# Patient Record
Sex: Female | Born: 1975 | Race: Black or African American | Hispanic: No | Marital: Single | Smoking: Never smoker
Health system: Southern US, Community
[De-identification: ages and names within clinical notes are randomized; demographics above are authoritative.]

## PROBLEM LIST (undated history)

## (undated) ENCOUNTER — Emergency Department (HOSPITAL_COMMUNITY): Admission: EM | Discharge status: Absent without leave | Payer: 59

## (undated) DIAGNOSIS — N739 Female pelvic inflammatory disease, unspecified: Secondary | ICD-10-CM

## (undated) HISTORY — PX: TONSILLECTOMY: SUR1361

## (undated) HISTORY — PX: TUBAL LIGATION: SHX77

## (undated) HISTORY — PX: DILATION AND CURETTAGE OF UTERUS: SHX78

---

## 2006-08-17 ENCOUNTER — Emergency Department (HOSPITAL_COMMUNITY): Admission: EM | Admit: 2006-08-17 | Discharge: 2006-08-17 | Payer: Self-pay | Admitting: Emergency Medicine

## 2009-10-23 ENCOUNTER — Encounter: Admission: RE | Admit: 2009-10-23 | Discharge: 2009-10-23 | Payer: Self-pay | Admitting: Obstetrics and Gynecology

## 2010-12-06 DIAGNOSIS — N739 Female pelvic inflammatory disease, unspecified: Secondary | ICD-10-CM

## 2010-12-06 HISTORY — DX: Female pelvic inflammatory disease, unspecified: N73.9

## 2011-08-14 ENCOUNTER — Encounter (HOSPITAL_COMMUNITY): Payer: Self-pay

## 2011-08-14 ENCOUNTER — Observation Stay (HOSPITAL_COMMUNITY)
Admission: AD | Admit: 2011-08-14 | Discharge: 2011-08-15 | Disposition: A | Discharge status: Home / Self Care | Payer: 59 | Source: Ambulatory Visit | Attending: Obstetrics | Admitting: Obstetrics

## 2011-08-14 DIAGNOSIS — N949 Unspecified condition associated with female genital organs and menstrual cycle: Secondary | ICD-10-CM | POA: Insufficient documentation

## 2011-08-14 DIAGNOSIS — R109 Unspecified abdominal pain: Secondary | ICD-10-CM | POA: Insufficient documentation

## 2011-08-14 DIAGNOSIS — N739 Female pelvic inflammatory disease, unspecified: Principal | ICD-10-CM | POA: Insufficient documentation

## 2011-08-14 DIAGNOSIS — R112 Nausea with vomiting, unspecified: Secondary | ICD-10-CM | POA: Insufficient documentation

## 2011-08-14 DIAGNOSIS — N730 Acute parametritis and pelvic cellulitis: Secondary | ICD-10-CM | POA: Diagnosis present

## 2011-08-14 DIAGNOSIS — N83209 Unspecified ovarian cyst, unspecified side: Secondary | ICD-10-CM | POA: Insufficient documentation

## 2011-08-14 DIAGNOSIS — R102 Pelvic and perineal pain unspecified side: Secondary | ICD-10-CM | POA: Diagnosis present

## 2011-08-14 LAB — COMPREHENSIVE METABOLIC PANEL
ALT: 13 U/L (ref 0–35)
AST: 14 U/L (ref 0–37)
Albumin: 3.5 g/dL (ref 3.5–5.2)
Alkaline Phosphatase: 56 U/L (ref 39–117)
BUN: 7 mg/dL (ref 6–23)
CO2: 27 mEq/L (ref 19–32)
Calcium: 9.2 mg/dL (ref 8.4–10.5)
Chloride: 104 mEq/L (ref 96–112)
Creatinine, Ser: 0.75 mg/dL (ref 0.50–1.10)
GFR calc Af Amer: >60 mL/min (ref >60)
GFR calc non Af Amer: >60 mL/min (ref >60)
Glucose, Bld: 92 mg/dL (ref 70–99)
Potassium: 3.4 mEq/L — ABNORMAL LOW (ref 3.5–5.1)
Sodium: 138 mEq/L (ref 135–145)
Total Bilirubin: 0.6 mg/dL (ref 0.3–1.2)
Total Protein: 6.8 g/dL (ref 6.0–8.3)

## 2011-08-14 LAB — CBC
HCT: 36.1 % (ref 36.0–46.0)
Hemoglobin: 11.9 g/dL — ABNORMAL LOW (ref 12.0–15.0)
MCH: 28.9 pg (ref 26.0–34.0)
MCHC: 33 g/dL (ref 30.0–36.0)
MCV: 87.6 fL (ref 78.0–100.0)
Platelets: 314 10*3/uL (ref 150–400)
RBC: 4.12 MIL/uL (ref 3.87–5.11)
RDW: 13.7 % (ref 11.5–15.5)
WBC: 16.9 10*3/uL — ABNORMAL HIGH (ref 4.0–10.5)

## 2011-08-14 LAB — DIFFERENTIAL
Basophils Absolute: 0 10*3/uL (ref 0.0–0.1)
Basophils Relative: 0 % (ref 0–1)
Eosinophils Absolute: 0.1 10*3/uL (ref 0.0–0.7)
Eosinophils Relative: 1 % (ref 0–5)
Lymphocytes Relative: 20 % (ref 12–46)
Lymphs Abs: 3.3 10*3/uL (ref 0.7–4.0)
Monocytes Absolute: 1 10*3/uL (ref 0.1–1.0)
Monocytes Relative: 6 % (ref 3–12)
Neutro Abs: 12.5 10*3/uL — ABNORMAL HIGH (ref 1.7–7.7)
Neutrophils Relative %: 74 % (ref 43–77)

## 2011-08-14 LAB — URINALYSIS, ROUTINE W REFLEX MICROSCOPIC
Bilirubin Urine: NEGATIVE
Glucose, UA: NEGATIVE mg/dL
Ketones, ur: NEGATIVE mg/dL
Leukocytes, UA: NEGATIVE
Nitrite: NEGATIVE
Protein, ur: NEGATIVE mg/dL
Specific Gravity, Urine: 1.015 (ref 1.005–1.030)
Urobilinogen, UA: 0.2 mg/dL (ref 0.0–1.0)
pH: 6 (ref 5.0–8.0)

## 2011-08-14 LAB — URINE MICROSCOPIC-ADD ON

## 2011-08-14 LAB — WET PREP, GENITAL
Trich, Wet Prep: NONE SEEN
Yeast Wet Prep HPF POC: NONE SEEN

## 2011-08-14 LAB — HCG, SERUM, QUALITATIVE: Preg, Serum: NEGATIVE

## 2011-08-14 MED ORDER — KETOROLAC TROMETHAMINE 30 MG/ML IJ SOLN: 30.0000 mg | Freq: Four times a day (QID) | INTRAMUSCULAR | Status: DC

## 2011-08-14 MED ORDER — KETOROLAC TROMETHAMINE 30 MG/ML IJ SOLN
30.0000 mg | Freq: Four times a day (QID) | INTRAMUSCULAR | Status: DC
  Administered 2011-08-14 – 2011-08-15 (×4): 30 mg via INTRAVENOUS
  Filled 2011-08-14 (×4): qty 1

## 2011-08-14 MED ORDER — PROMETHAZINE HCL 25 MG/ML IJ SOLN
12.5000 mg | INTRAMUSCULAR | Status: DC | PRN
  Administered 2011-08-14 – 2011-08-15 (×2): 12.5 mg via INTRAVENOUS
  Filled 2011-08-14 (×2): qty 1

## 2011-08-14 MED ORDER — CEFTRIAXONE SODIUM 250 MG IJ SOLR
250.0000 mg | INTRAMUSCULAR | Status: DC
  Administered 2011-08-14: 250 mg via INTRAMUSCULAR
  Filled 2011-08-14: qty 250

## 2011-08-14 MED ORDER — AZITHROMYCIN 1 G PO PACK
1.0000 g | PACK | Freq: Once | ORAL | Status: AC
  Administered 2011-08-15: 1 g via ORAL
  Filled 2011-08-14: qty 1

## 2011-08-14 MED ORDER — ZOLPIDEM TARTRATE 5 MG PO TABS: 5.0000 mg | ORAL_TABLET | Freq: Every evening | ORAL | Status: DC | PRN

## 2011-08-14 MED ORDER — METRONIDAZOLE IN NACL 5-0.79 MG/ML-% IV SOLN
500.0000 mg | Freq: Four times a day (QID) | INTRAVENOUS | Status: DC
  Administered 2011-08-14 – 2011-08-15 (×4): 500 mg via INTRAVENOUS
  Filled 2011-08-14 (×8): qty 100

## 2011-08-14 MED ORDER — DOXYCYCLINE HYCLATE 100 MG IV SOLR
100.0000 mg | Freq: Two times a day (BID) | INTRAVENOUS | Status: DC
  Administered 2011-08-14 – 2011-08-15 (×2): 100 mg via INTRAVENOUS
  Filled 2011-08-14 (×4): qty 100

## 2011-08-14 MED ORDER — LACTATED RINGERS IV SOLN
INTRAVENOUS | Status: DC
  Administered 2011-08-14 – 2011-08-15 (×2): via INTRAVENOUS

## 2011-08-14 NOTE — ED Provider Notes (Signed)
History     Chief Complaint  Patient presents with  . Abdominal Pain   Abdominal Pain This is a new problem. The current episode started yesterday. The onset quality is sudden. The problem occurs constantly. The pain is located in the LLQ and generalized abdominal region. The pain is moderate. The quality of the pain is aching, cramping and sharp. The abdominal pain does not radiate. Associated symptoms include a fever, nausea and vomiting. Pertinent negatives include no anorexia, constipation, diarrhea, dysuria, frequency or headaches. It is movement what aggravates the pain. The pain is relieved by nothing. She has tried oral narcotic analgesics for the symptoms. The treatment provided no relief. Prior diagnostic workup includes CT scan. Her past medical history is significant for abdominal surgery. C/S x 3 - failed BTL - open tube unilateral with pregnancy after BTL  Seen at [facility] late last night told she had GI virus after CT scan - given antiemetic and analgesia. Pain persisted and worsened with 2 more episodes of vomiting - went to Urgent care where they recommended being seen by [facility]. She was told her white count was high at 18. Onset vaginal discharge today - brown with odor. LMP 8-13 to 8/17. Reports intermittent breast tenderness as well.    History reviewed. No pertinent past medical history.  Past Surgical History  Procedure Date  . Cesarean section   . Dilation and curettage of uterus   . Tubal ligation   . Tonsillectomy     Family History  Problem Relation Age of Onset  . Arthritis Mother   . Hypertension Mother   . Arthritis Father   . Cancer Father   . Hypertension Sister   . Stroke Sister   . Kidney disease Sister     History  Substance Use Topics  . Smoking status: Never Smoker   . Smokeless tobacco: Never Used  . Alcohol Use: No    Allergies: No Known Allergies  Prescriptions prior to admission  Medication Sig Dispense Refill  . BORIC ACID EX  Place 1 capsule vaginally at bedtime as needed. For "maintenance"       . Naproxen Sodium (ALEVE PO) Take 2 tablets by mouth as needed. For pain, headache, cramps          Review of Systems  Constitutional: Positive for fever and malaise/fatigue. Negative for chills.  Gastrointestinal: Positive for nausea, vomiting and abdominal pain. Negative for diarrhea, constipation and anorexia.  Genitourinary: Negative for dysuria and frequency.  Neurological: Negative for weakness and headaches.   Physical Exam   Blood pressure 143/86, pulse 85, temperature 99.4 F (37.4 C), temperature source Oral, resp. rate 18, height 5\' 2"  (1.575 m), weight 100.426 kg (221 lb 6.4 oz), last menstrual period 07/21/2011.  Physical Exam  Constitutional: She is oriented to person, place, and time. She appears well-developed and well-nourished.  HENT:  Head: Normocephalic.  Neck: Normal range of motion. No thyromegaly present.  Cardiovascular: Normal rate.   GI: Soft. Bowel sounds are normal. She exhibits no distension and no mass. There is tenderness. There is rebound. There is no guarding.  Genitourinary: Vaginal discharge found.  Musculoskeletal: Normal range of motion.  Neurological: She is alert and oriented to person, place, and time.  Skin: Skin is warm and dry.  Psychiatric: She has a normal mood and affect.  Spec exam : + malodorous discharge - tannish-brown in color / watery consistency - wet prep and STD probe sent Bimanual : uterus retroverted with + CMT /  Adnexal bilaterally tender   MAU Course  Procedures - review CT from [facility]  Mild atelectasis present bilaterally.  Liver - gallbladder - spleen - adrenals - pancreas - kidneys normal. Normal appendix visualized. ? Hypodense lesion Right adnexa - 2cm size - possible ovarian cyst. No free fluid or free air. Bladder normal.  + Right lymphedema - right lower pelvis   Assessment and Plan  Abdominal Pain  No evidence of  appendicitis Possible pelvic infection - unknown etiology at this point - STD panel pending Must rule out pregnancy and risk of ectopic  Consult MD when initially labs are resulted  Start IV fluids for hydration - hold analgesia until pregnancy test is resulted  Stephanie Lee 08/14/2011, 5:20 PM

## 2011-08-14 NOTE — ED Provider Notes (Signed)
Agree with note and plan. Inpatient IVF and antibiotics since nausea/vomiting and will fail therapy. If better tomorrow, will d/c home with outpatient PID treatment.

## 2011-08-14 NOTE — H&P (Signed)
Stephanie Lee is an 35 y.o. female. With acute abdominal pain x 2 days - seen at [facility] - CT negative except 2.5 cm ovarian cyst Right adnexa Positive nausea and vomiting - given Zofran last night - nothing to eat or drink today.   Pertinent Gynecological History: Menses: regular every month without intermenstrual spotting Bleeding: brown discharge this AM Contraception: none Sexually transmitted diseases: no past history and history recurrent BV Previous GYN Procedures: CS x 3 - failed BTL  Last mammogram: normal Date: 2012 Last pap: normal Date: Dec 2011 OB History: G3, P3   Menstrual History: Patient's last menstrual period was 07/21/2011.    History reviewed. No pertinent past medical history.  Past Surgical History  Procedure Date  . Cesarean section   . Dilation and curettage of uterus   . Tubal ligation   . Tonsillectomy     Family History  Problem Relation Age of Onset  . Arthritis Mother   . Hypertension Mother   . Arthritis Father   . Cancer Father   . Hypertension Sister   . Stroke Sister   . Kidney disease Sister     Social History:  reports that she has never smoked. She has never used smokeless tobacco. She reports that she does not drink alcohol or use illicit drugs.  Allergies: No Known Allergies  Prescriptions prior to admission  Medication Sig Dispense Refill  . BORIC ACID EX Place 1 capsule vaginally at bedtime as needed. For "maintenance"       . Naproxen Sodium (ALEVE PO) Take 2 tablets by mouth as needed. For pain, headache, cramps          ROS : Abdominal pain x 24 hours - + nausea with vomiting - + vaginal discharge with odor  Blood pressure 143/86, pulse 85, temperature 99.4 F (37.4 C), temperature source Oral, resp. rate 18, height 5\' 2"  (1.575 m), weight 100.426 kg (221 lb 6.4 oz), last menstrual period 07/21/2011. Physical Exam  See MAU note for complete examination  Results for orders placed during the hospital  encounter of 08/14/11 (from the past 24 hour(s))  URINALYSIS, ROUTINE W REFLEX MICROSCOPIC     Status: Abnormal   Collection Time   08/14/11  5:14 PM      Component Value Range   Color, Urine YELLOW  YELLOW    Appearance CLEAR  CLEAR    Specific Gravity, Urine 1.015  1.005 - 1.030    pH 6.0  5.0 - 8.0    Glucose, UA NEGATIVE  NEGATIVE (mg/dL)   Hgb urine dipstick TRACE (*) NEGATIVE    Bilirubin Urine NEGATIVE  NEGATIVE    Ketones, ur NEGATIVE  NEGATIVE (mg/dL)   Protein, ur NEGATIVE  NEGATIVE (mg/dL)   Urobilinogen, UA 0.2  0.0 - 1.0 (mg/dL)   Nitrite NEGATIVE  NEGATIVE    Leukocytes, UA NEGATIVE  NEGATIVE   URINE MICROSCOPIC-ADD ON     Status: Normal   Collection Time   08/14/11  5:14 PM      Component Value Range   WBC, UA 0-2  <3 (WBC/hpf)   RBC / HPF 0-2  <3 (RBC/hpf)  WET PREP, GENITAL     Status: Abnormal   Collection Time   08/14/11  5:20 PM      Component Value Range   Yeast, Wet Prep NONE SEEN  NONE SEEN    Trich, Wet Prep NONE SEEN  NONE SEEN    Clue Cells, Wet Prep MANY (*) NONE  SEEN    WBC, Wet Prep HPF POC MANY (*) NONE SEEN   CBC     Status: Abnormal   Collection Time   08/14/11  5:22 PM      Component Value Range   WBC 16.9 (*) 4.0 - 10.5 (K/uL)   RBC 4.12  3.87 - 5.11 (MIL/uL)   Hemoglobin 11.9 (*) 12.0 - 15.0 (g/dL)   HCT 91.4  78.2 - 95.6 (%)   MCV 87.6  78.0 - 100.0 (fL)   MCH 28.9  26.0 - 34.0 (pg)   MCHC 33.0  30.0 - 36.0 (g/dL)   RDW 21.3  08.6 - 57.8 (%)   Platelets 314  150 - 400 (K/uL)  DIFFERENTIAL     Status: Abnormal   Collection Time   08/14/11  5:22 PM      Component Value Range   Neutrophils Relative 74  43 - 77 (%)   Neutro Abs 12.5 (*) 1.7 - 7.7 (K/uL)   Lymphocytes Relative 20  12 - 46 (%)   Lymphs Abs 3.3  0.7 - 4.0 (K/uL)   Monocytes Relative 6  3 - 12 (%)   Monocytes Absolute 1.0  0.1 - 1.0 (K/uL)   Eosinophils Relative 1  0 - 5 (%)   Eosinophils Absolute 0.1  0.0 - 0.7 (K/uL)   Basophils Relative 0  0 - 1 (%)   Basophils Absolute  0.0  0.0 - 0.1 (K/uL)  COMPREHENSIVE METABOLIC PANEL     Status: Abnormal   Collection Time   08/14/11  5:22 PM      Component Value Range   Sodium 138  135 - 145 (mEq/L)   Potassium 3.4 (*) 3.5 - 5.1 (mEq/L)   Chloride 104  96 - 112 (mEq/L)   CO2 27  19 - 32 (mEq/L)   Glucose, Bld 92  70 - 99 (mg/dL)   BUN 7  6 - 23 (mg/dL)   Creatinine, Ser 4.69  0.50 - 1.10 (mg/dL)   Calcium 9.2  8.4 - 62.9 (mg/dL)   Total Protein 6.8  6.0 - 8.3 (g/dL)   Albumin 3.5  3.5 - 5.2 (g/dL)   AST 14  0 - 37 (U/L)   ALT 13  0 - 35 (U/L)   Alkaline Phosphatase 56  39 - 117 (U/L)   Total Bilirubin 0.6  0.3 - 1.2 (mg/dL)   GFR calc non Af Amer >60  >60 (mL/min)   GFR calc Af Amer >60  >60 (mL/min)  HCG, SERUM, QUALITATIVE     Status: Normal   Collection Time   08/14/11  5:22 PM      Component Value Range   Preg, Serum NEGATIVE  NEGATIVE     No results found.  Assessment/Plan: Abdominal pain Presumptive PID - possible STD Negative pregnancy test - ruled out risk ectopic No evidence of appendicitis or GI etiology  1) Consulted with Dr Juliene Pina - outpt versus 23 hours obs with IVF and antibiotics - agree with plan to keep tonight 2) IV ABX : Doxycycline 100 IV Q12 / Flagyl 500mg  Q12 3) treatment for presumptive risk for STD - azithromycin and rocephin 4) anticipate discharge in 12-24 hours with improved status 5) Give toradol for pain / phenergan for N/V    [name] 08/14/2011, 6:07 PM

## 2011-08-14 NOTE — Progress Notes (Signed)
Was seen at [facility] last night, got worse was seen at Urgent Care, had CT scan cyst on left side, pain is midline radiating to right, was told had stomach virus, elevated WBC abdomen tender to touch thinks might be PID

## 2011-08-14 NOTE — H&P (Signed)
Agree with note and plan. Findings and management reviewed.

## 2011-08-15 DIAGNOSIS — N730 Acute parametritis and pelvic cellulitis: Secondary | ICD-10-CM | POA: Diagnosis present

## 2011-08-15 MED ORDER — DOXYCYCLINE HYCLATE 100 MG PO TABS: 100.0000 mg | ORAL_TABLET | Freq: Two times a day (BID) | ORAL | Status: AC

## 2011-08-15 MED ORDER — SODIUM CHLORIDE 0.9 % IJ SOLN
3.0000 mL | INTRAMUSCULAR | Status: DC | PRN
  Administered 2011-08-15 (×4): 3 mL via INTRAVENOUS

## 2011-08-15 MED ORDER — PROMETHAZINE HCL 12.5 MG PO TABS: 12.5000 mg | ORAL_TABLET | Freq: Two times a day (BID) | ORAL | Status: AC

## 2011-08-15 MED ORDER — METRONIDAZOLE 500 MG PO TABS: 500.0000 mg | ORAL_TABLET | Freq: Two times a day (BID) | ORAL | Status: AC

## 2011-08-15 NOTE — Plan of Care (Signed)
Problem: Phase I Progression Outcomes Goal: Pain controlled with appropriate interventions Outcome: Progressing Patient received IV Phenergan and Toradol in MAU Goal: OOB as tolerated unless otherwise ordered Outcome: Progressing Up to bathroom. Goal: Initial discharge plan identified Outcome: Progressing Pain control and understanding of what can cause PID Goal: Voiding-avoid urinary catheter unless indicated Outcome: Progressing Patient voids on her own  Problem: Phase II Progression Outcomes Goal: Progress activity as tolerated unless otherwise ordered Outcome: Progressing BRP Goal: IV changed to normal saline lock Outcome: Not Applicable Date Met:  08/15/11 IV site is new and CDI

## 2011-08-15 NOTE — Discharge Summary (Signed)
RN reviewed discharge instructions with patient and gave her prescriptions provided by MD. Patient had no complaints at time of discharge. Patient was walked off the unit by nurse tech. She left the campus in a private vehicle.

## 2011-08-15 NOTE — Progress Notes (Signed)
Subjective: Admitted for presumed PID and nausea vomiting, needing hospitalization for PID treatment initiation. Admission reviewed with CNM on 08/14/11.  Overnight she did well, pain much improved with mild tenderness on touch, no further nausea/vomiting, tolerated general diet for breakfast . No fever/chills/flu like symptoms. Feels much better and ready to complete outpatient PID treatment.  LMP 07/22/11.  Objective: Vital signs in last 24 hours: Temp:  [98 F (36.7 C)-99.4 F (37.4 C)] 98.1 F (36.7 C) (09/09 0622) Pulse Rate:  [72-98] 98  (09/09 0622) Resp:  [16-18] 18  (09/09 0622) BP: (93-143)/(59-86) 113/74 mmHg (09/09 0622) SpO2:  [97 %-98 %] 98 % (09/09 0622) Weight:  [221 lb 6.4 oz (100.426 kg)] 221 lb 6.4 oz (100.426 kg) (09/08 1444)  Intake/Output from previous day: 09/08 0701 - 09/09 0700 In: 570 [P.O.:220] Out: 500 [Urine:500] Intake/Output this shift: Total I/O In: 3 [I.V.:3] Out: 300 [Urine:300]  A&O x 3, no acute distress. Pleasant HEENT neg, no thyromegaly Lungs CTA bilat CV RRR, S1S2 normal Abdo soft,  non acute with no rebound/ guarding. Minimal generalized tenderness in all quadrants, normal bowel sounds all quadrants.  Extr no edema/ tenderness Pelvic deferred.   Results for orders placed during the hospital encounter of 08/14/11 (from the past 24 hour(s))  URINALYSIS, ROUTINE W REFLEX MICROSCOPIC     Status: Abnormal   Collection Time   08/14/11  5:14 PM      Component Value Range   Color, Urine YELLOW  YELLOW    Appearance CLEAR  CLEAR    Specific Gravity, Urine 1.015  1.005 - 1.030    pH 6.0  5.0 - 8.0    Glucose, UA NEGATIVE  NEGATIVE (mg/dL)   Hgb urine dipstick TRACE (*) NEGATIVE    Bilirubin Urine NEGATIVE  NEGATIVE    Ketones, ur NEGATIVE  NEGATIVE (mg/dL)   Protein, ur NEGATIVE  NEGATIVE (mg/dL)   Urobilinogen, UA 0.2  0.0 - 1.0 (mg/dL)   Nitrite NEGATIVE  NEGATIVE    Leukocytes, UA NEGATIVE  NEGATIVE   URINE MICROSCOPIC-ADD ON      Status: Normal   Collection Time   08/14/11  5:14 PM      Component Value Range   WBC, UA 0-2  <3 (WBC/hpf)   RBC / HPF 0-2  <3 (RBC/hpf)  WET PREP, GENITAL     Status: Abnormal   Collection Time   08/14/11  5:20 PM      Component Value Range   Yeast, Wet Prep NONE SEEN  NONE SEEN    Trich, Wet Prep NONE SEEN  NONE SEEN    Clue Cells, Wet Prep MANY (*) NONE SEEN    WBC, Wet Prep HPF POC MANY (*) NONE SEEN   CBC     Status: Abnormal   Collection Time   08/14/11  5:22 PM      Component Value Range   WBC 16.9 (*) 4.0 - 10.5 (K/uL)   RBC 4.12  3.87 - 5.11 (MIL/uL)   Hemoglobin 11.9 (*) 12.0 - 15.0 (g/dL)   HCT 16.1  09.6 - 04.5 (%)   MCV 87.6  78.0 - 100.0 (fL)   MCH 28.9  26.0 - 34.0 (pg)   MCHC 33.0  30.0 - 36.0 (g/dL)   RDW 40.9  81.1 - 91.4 (%)   Platelets 314  150 - 400 (K/uL)  DIFFERENTIAL     Status: Abnormal   Collection Time   08/14/11  5:22 PM      Component Value  Range   Neutrophils Relative 74  43 - 77 (%)   Neutro Abs 12.5 (*) 1.7 - 7.7 (K/uL)   Lymphocytes Relative 20  12 - 46 (%)   Lymphs Abs 3.3  0.7 - 4.0 (K/uL)   Monocytes Relative 6  3 - 12 (%)   Monocytes Absolute 1.0  0.1 - 1.0 (K/uL)   Eosinophils Relative 1  0 - 5 (%)   Eosinophils Absolute 0.1  0.0 - 0.7 (K/uL)   Basophils Relative 0  0 - 1 (%)   Basophils Absolute 0.0  0.0 - 0.1 (K/uL)  COMPREHENSIVE METABOLIC PANEL     Status: Abnormal   Collection Time   08/14/11  5:22 PM      Component Value Range   Sodium 138  135 - 145 (mEq/L)   Potassium 3.4 (*) 3.5 - 5.1 (mEq/L)   Chloride 104  96 - 112 (mEq/L)   CO2 27  19 - 32 (mEq/L)   Glucose, Bld 92  70 - 99 (mg/dL)   BUN 7  6 - 23 (mg/dL)   Creatinine, Ser 1.61  0.50 - 1.10 (mg/dL)   Calcium 9.2  8.4 - 09.6 (mg/dL)   Total Protein 6.8  6.0 - 8.3 (g/dL)   Albumin 3.5  3.5 - 5.2 (g/dL)   AST 14  0 - 37 (U/L)   ALT 13  0 - 35 (U/L)   Alkaline Phosphatase 56  39 - 117 (U/L)   Total Bilirubin 0.6  0.3 - 1.2 (mg/dL)   GFR calc non Af Amer >60  >60 (mL/min)     GFR calc Af Amer >60  >60 (mL/min)  HCG, SERUM, QUALITATIVE     Status: Normal   Collection Time   08/14/11  5:22 PM      Component Value Range   Preg, Serum NEGATIVE  NEGATIVE     Studies/Results: No labs today.   Scheduled Meds:   . azithromycin  1 g Oral Once  . doxycycline (VIBRAMYCIN) IV  100 mg Intravenous Q12H  . ketorolac  30 mg Intravenous Q6H   Or  . ketorolac  30 mg Intramuscular Q6H  . metronidazole  500 mg Intravenous Q6H  . DISCONTD: cefTRIAXone  250 mg Intramuscular Q24H   Continuous Infusions:   . lactated ringers Stopped (08/15/11 0808)   PRN Meds:promethazine, sodium chloride, zolpidem  Assessment/Plan: HD #2, PID. Much improved, pain better, nausea/vomiting resolved.  S/p Ceftriaxone and PO Azithromycin for Gono/chlam coverage.  Continue Doxy/ flagyl for total 14 days PO Phenergan 12.5 mg PO with medications (since gets nausea with flagyl)  Ibuprofen prn.  F/up 2 wks in office with Dr Cherly Hensen.  Reportable/ warning signs reviewed, has good support. Resume work tomorrow if possible.   Korissa Horsford R

## 2011-08-15 NOTE — Discharge Summary (Signed)
  Physician Discharge Summary  Patient ID: Stephanie Lee  MRN: [id number]  DOB/AGE: July 21, 1976 35 y.o.  Admit date: 08/14/2011 Discharge date: 08/15/2011  Admission Diagnoses: Pelvic pain, nausea, vomiting, PID Discharge Diagnoses: same Discharged Condition: good, improved  Hospital Course: Admitted for inpatient PID treatment due to nausea/vomiting and not able to take any medications. Did well overnight, was able to tolerate general diet for breakfast and take PO Azithromycin. Nausea/vomiting/ pain resolved.   Significant Diagnostic Studies:  [facility] - CT negative except 2.5 cm ovarian cyst Right adnexa  Treatments:  S/p Ceftriaxone and PO Azithromycin for Gono/chlam coverage.  Doxy/ flagyl IV q 12 hrs, and will continue PO for total 14 days  Phenergan 12.5 mg PO with medications (since gets nausea with flagyl)  Ibuprofen prn.   Discharge Exam: Blood pressure 113/74, pulse 98, temperature 98.1 F (36.7 C), temperature source Oral, resp. rate 18, height 5\' 2"  (1.575 m), weight 221 lb 6.4 oz (100.426 kg), last menstrual period 07/21/2011, SpO2 98.00%. Improved exam.   Disposition: Home  Current Discharge Medication List    START taking these medications   Details  doxycycline (VIBRA-TABS) 100 MG tablet Take 1 tablet (100 mg total) by mouth 2 (two) times daily. Qty: 26 tablet, Refills: 0    metroNIDAZOLE (FLAGYL) 500 MG tablet Take 1 tablet (500 mg total) by mouth 2 (two) times daily. Qty: 26 tablet, Refills: 0    promethazine (PHENERGAN) 12.5 MG tablet Take 1 tablet (12.5 mg total) by mouth 2 (two) times daily. Qty: 30 tablet, Refills: 0      CONTINUE these medications which have NOT CHANGED   Details  BORIC ACID EX Place 1 capsule vaginally at bedtime as needed. For "maintenance"     Naproxen Sodium (ALEVE PO) Take 2 tablets by mouth as needed. For pain, headache, cramps         F/up: Dr Cherly Hensen in 2 weeks in office.  Reportable/ warning signs reviewed,  has good support. Resume work tomorrow if possible.   --V.Juliene Pina, MD Jandy Brackens R 08/15/2011, 11:48 AM

## 2011-08-16 LAB — URINE CULTURE
Colony Count: 10000
Culture  Setup Time: 201209082254

## 2011-08-16 LAB — GC/CHLAMYDIA PROBE AMP, GENITAL
Chlamydia, DNA Probe: NEGATIVE
GC Probe Amp, Genital: NEGATIVE

## 2012-08-05 ENCOUNTER — Inpatient Hospital Stay (HOSPITAL_COMMUNITY)
Admission: AD | Admit: 2012-08-05 | Discharge: 2012-08-05 | Disposition: A | Discharge status: Home / Self Care | Payer: 59 | Source: Ambulatory Visit | Attending: Obstetrics and Gynecology | Admitting: Obstetrics and Gynecology

## 2012-08-05 ENCOUNTER — Encounter (HOSPITAL_COMMUNITY): Payer: Self-pay | Admitting: *Deleted

## 2012-08-05 ENCOUNTER — Inpatient Hospital Stay (HOSPITAL_COMMUNITY): Payer: 59

## 2012-08-05 DIAGNOSIS — R1031 Right lower quadrant pain: Secondary | ICD-10-CM | POA: Insufficient documentation

## 2012-08-05 HISTORY — DX: Female pelvic inflammatory disease, unspecified: N73.9

## 2012-08-05 LAB — CBC
HCT: 33.8 % — ABNORMAL LOW (ref 36.0–46.0)
MCH: 28.6 pg (ref 26.0–34.0)
MCV: 86.4 fL (ref 78.0–100.0)
Platelets: 315 10*3/uL (ref 150–400)
RBC: 3.91 MIL/uL (ref 3.87–5.11)

## 2012-08-05 LAB — WET PREP, GENITAL

## 2012-08-05 LAB — URINALYSIS, ROUTINE W REFLEX MICROSCOPIC
Bilirubin Urine: NEGATIVE
Ketones, ur: NEGATIVE mg/dL
Nitrite: NEGATIVE
pH: 7 (ref 5.0–8.0)

## 2012-08-05 MED ORDER — METRONIDAZOLE 500 MG PO TABS: 500.0000 mg | ORAL_TABLET | Freq: Two times a day (BID) | ORAL | Status: AC

## 2012-08-05 MED ORDER — KETOROLAC TROMETHAMINE 30 MG/ML IJ SOLN
30.0000 mg | Freq: Once | INTRAMUSCULAR | Status: DC
Start: 2012-08-05 — End: 2012-08-05
  Filled 2012-08-05: qty 1

## 2012-08-05 MED ORDER — DOXYCYCLINE HYCLATE 100 MG PO CAPS: 100.0000 mg | ORAL_CAPSULE | Freq: Two times a day (BID) | ORAL | Status: AC

## 2012-08-05 MED ORDER — OXYCODONE-ACETAMINOPHEN 5-325 MG PO TABS
1.0000 | ORAL_TABLET | Freq: Once | ORAL | Status: AC
  Administered 2012-08-05: 1 via ORAL
  Filled 2012-08-05: qty 1

## 2012-08-05 MED ORDER — KETOROLAC TROMETHAMINE 60 MG/2ML IM SOLN
30.0000 mg | Freq: Once | INTRAMUSCULAR | Status: AC
  Administered 2012-08-05: 30 mg via INTRAMUSCULAR

## 2012-08-05 NOTE — Progress Notes (Signed)
Dr Juliene Pina notified of pt's admission and status. Aware of upt neg and neg u/a. Orders received

## 2012-08-05 NOTE — Progress Notes (Signed)
Written and verbal d/c instructions given and understanding voiced. 

## 2012-08-05 NOTE — MAU Provider Note (Signed)
History     CSN: [id number]  Arrival date and time: 08/05/12 0248   None     Chief Complaint  Patient presents with  . Abdominal Pain   HPI Lower abdominal pain, 24 hrs. No abn vag discharge. Not with any new partner (current partner 3 yrs). Condoms. No nausea/ vomiting. No fever/chills. Pain is somewhat less intense since in MAU, now rates at 7/10 from 10/10 at home and Ibuprofen didn't help. No prior ovarian cysts/ no appendicitis hx. On further questioning, pain is often noted just before menses (this was worse) and can be on either side but always more intense on right. Pain gets better once period starts and she has at least 2-3 wks of pain free period before it starts again. No prior endometriosis hx, but has h/o recurrent BV.  Admitted for presumed PID Sept'12 overnight. Prior referral to pelvic pain clinic. C/s x 3, then failed tubal with IUP in 2002 (TOP), no repeat TL since. UPT negative today.  Recent UTI in office (Jul'13)- Klebsiella, was treated, but no TOC. UA neg today and no UTI s/s. No bowel complaints.    Past Medical History  Diagnosis Date  . PID (pelvic inflammatory disease) 2012    Past Surgical History  Procedure Date  . Cesarean section   . Dilation and curettage of uterus   . Tubal ligation   . Tonsillectomy     Family History  Problem Relation Age of Onset  . Arthritis Mother   . Hypertension Mother   . Arthritis Father   . Cancer Father   . Hypertension Sister   . Stroke Sister   . Kidney disease Sister   . Other Neg Hx     History  Substance Use Topics  . Smoking status: Never Smoker   . Smokeless tobacco: Never Used  . Alcohol Use: No    Allergies: No Known Allergies  Prescriptions prior to admission  Medication Sig Dispense Refill  . ibuprofen (ADVIL,MOTRIN) 200 MG tablet Take 600 mg by mouth every 6 (six) hours as needed.      . Naproxen Sodium (ALEVE PO) Take 2 tablets by mouth as needed. For pain, headache, cramps          . BORIC ACID EX Place 1 capsule vaginally at bedtime as needed. For "maintenance"         ROS Physical Exam   Blood pressure 133/80, pulse 82, temperature 98.6 F (37 C), temperature source Oral, resp. rate 20, height 5\' 2"  (1.575 m), weight 220 lb (99.791 kg), last menstrual period 06/26/2012.  Physical Exam Physical exam:  A&O x 3, no acute distress. Pleasant HEENT neg, no thyromegaly Lungs CTA bilat CV RRR, S1S2 normal Abdo soft, non acute but tender in RLQ. No rebound/guarding/ Extr no edema/ tenderness Pelvic- Spec exam- period blood (not heavy, but mucus like bloody dc) noted. Wet prep, G/C obtained. No CMT. Right adnexal/uterine tenderness (exam difficult due to body habitus).    MAU Course  Procedures Pelvic sono - essentially negative. Blood clot in endometrium. Right ovary not seen but no masses. Left ovary normal. No pelvic abscess.  UA -neg CBC - WBC 8, nl.  Wet prep- G/C sent  Assessment and Plan  RLQ pain. Does not appear to be PID but due to prior recurrent BV hx, will treat with Doxy/ Flagyl for 7 days.  Likely cyclic changes in uterus/ endometriosis/ adenomyosis/ pelvic adhesions, but mostly premenses "flares" of pain. No HTN, non smoker. Could benefit from  Continous OCs.   Plan- Percocet x1, Toradol IM. Rx Doxy, Flagyl. F/up office 2 wks.  Patient understands and agrees.   Zayyan Mullen R 08/05/2012, 5:13 AM

## 2012-08-05 NOTE — MAU Note (Signed)
Sharp pain in my right lower stomach since Friday morning. Has gradually gotten worse. Tried Ibuprofen and Aleve which aren't helping.

## 2012-08-08 LAB — GC/CHLAMYDIA PROBE AMP, GENITAL
Chlamydia, DNA Probe: NEGATIVE
GC Probe Amp, Genital: NEGATIVE

## 2014-10-07 ENCOUNTER — Encounter (HOSPITAL_COMMUNITY): Payer: Self-pay | Admitting: *Deleted

## 2015-05-10 ENCOUNTER — Inpatient Hospital Stay (HOSPITAL_COMMUNITY)
Admission: AD | Admit: 2015-05-10 | Discharge: 2015-05-11 | Disposition: A | Discharge status: Home / Self Care | Payer: 59 | Source: Ambulatory Visit | Attending: Obstetrics and Gynecology | Admitting: Obstetrics and Gynecology

## 2015-05-10 DIAGNOSIS — R197 Diarrhea, unspecified: Secondary | ICD-10-CM | POA: Diagnosis present

## 2015-05-10 DIAGNOSIS — A084 Viral intestinal infection, unspecified: Secondary | ICD-10-CM | POA: Diagnosis not present

## 2015-05-10 DIAGNOSIS — R109 Unspecified abdominal pain: Secondary | ICD-10-CM | POA: Diagnosis not present

## 2015-05-10 LAB — URINALYSIS, ROUTINE W REFLEX MICROSCOPIC
BILIRUBIN URINE: NEGATIVE
GLUCOSE, UA: NEGATIVE mg/dL
HGB URINE DIPSTICK: NEGATIVE
Ketones, ur: NEGATIVE mg/dL
LEUKOCYTES UA: NEGATIVE
Nitrite: NEGATIVE
PH: 5.5 (ref 5.0–8.0)
Protein, ur: NEGATIVE mg/dL
Specific Gravity, Urine: 1.025 (ref 1.005–1.030)
Urobilinogen, UA: 0.2 mg/dL (ref 0.0–1.0)

## 2015-05-10 LAB — POCT PREGNANCY, URINE: Preg Test, Ur: NEGATIVE

## 2015-05-10 NOTE — MAU Note (Signed)
Runny stools, nausea and vomiting (twice) , cramping LLQ of abd. Symptoms for 2 days.

## 2015-05-11 ENCOUNTER — Encounter (HOSPITAL_COMMUNITY): Payer: Self-pay | Admitting: *Deleted

## 2015-05-11 MED ORDER — LOPERAMIDE HCL 2 MG PO TABS: 2.0000 mg | ORAL_TABLET | Freq: Four times a day (QID) | ORAL | Status: DC | PRN

## 2015-05-11 MED ORDER — ONDANSETRON 4 MG PO TBDP
4.0000 mg | ORAL_TABLET | Freq: Once | ORAL | Status: AC
  Administered 2015-05-11: 4 mg via ORAL
  Filled 2015-05-11: qty 1

## 2015-05-11 MED ORDER — HYOSCYAMINE SULFATE 0.125 MG SL SUBL: 0.1250 mg | SUBLINGUAL_TABLET | SUBLINGUAL | Status: DC | PRN

## 2015-05-11 MED ORDER — LOPERAMIDE HCL 2 MG PO CAPS
2.0000 mg | ORAL_CAPSULE | Freq: Once | ORAL | Status: AC
  Administered 2015-05-11: 2 mg via ORAL
  Filled 2015-05-11: qty 1

## 2015-05-11 MED ORDER — ONDANSETRON 4 MG PO TBDP: 4.0000 mg | ORAL_TABLET | Freq: Three times a day (TID) | ORAL | Status: DC | PRN

## 2015-05-11 NOTE — Progress Notes (Signed)
Donette LarryMelanie Bhambri CNM in earlier to discuss d/c plan and medications. Written and verbal d/c instructions given and understanding voiced.

## 2015-05-11 NOTE — Progress Notes (Signed)
Kem ParkinsonM. Bhrambri CNM notified of pt's admission and status. Orders received and will see pt

## 2015-05-11 NOTE — Discharge Instructions (Signed)

## 2015-05-11 NOTE — MAU Provider Note (Signed)
  History     CSN: [id number]  Arrival date and time: 05/10/15 2235   First Provider Initiated Contact with Patient 05/11/15 0136      Chief Complaint  Patient presents with  . Diarrhea  . Emesis  . Fatigue   HPI Comments: [id number], non-pregnant female c/o nausea x2 episodes and several episodes of diarrhea for last 36 hrs. Reports severe intestinal cramping. No vomiting. Able to tolerate most liquids and some solid foods. No fever. No one in household sick.      Past Medical History  Diagnosis Date  . PID (pelvic inflammatory disease) 2012    Past Surgical History  Procedure Laterality Date  . Cesarean section    . Dilation and curettage of uterus    . Tubal ligation    . Tonsillectomy      Family History  Problem Relation Age of Onset  . Arthritis Mother   . Hypertension Mother   . Arthritis Father   . Cancer Father   . Hypertension Sister   . Stroke Sister   . Kidney disease Sister   . Other Neg Hx     History  Substance Use Topics  . Smoking status: Never Smoker   . Smokeless tobacco: Never Used  . Alcohol Use: No    Allergies: No Known Allergies  Prescriptions prior to admission  Medication Sig Dispense Refill Last Dose  . BORIC ACID EX Place 1 capsule vaginally at bedtime as needed. For "maintenance"    Past Week at Unknown time  . ibuprofen (ADVIL,MOTRIN) 200 MG tablet Take 600 mg by mouth every 6 (six) hours as needed.   08/04/2012 at 1500  . Naproxen Sodium (ALEVE PO) Take 2 tablets by mouth as needed. For pain, headache, cramps     08/04/2012 at 2300    Review of Systems  Constitutional: Positive for malaise/fatigue.  HENT: Negative.   Eyes: Negative.   Respiratory: Negative.   Cardiovascular: Negative.   Gastrointestinal: Positive for nausea, abdominal pain and diarrhea. Negative for blood in stool.  Genitourinary: Negative.   Musculoskeletal: Negative.   Skin: Negative.   Neurological: Negative.   Endo/Heme/Allergies: Negative.    Psychiatric/Behavioral: Negative.    Physical Exam   Blood pressure 137/89, pulse 91, temperature 98.9 F (37.2 C), resp. rate 20, height 5\' 2"  (1.575 m), weight 100.608 kg (221 lb 12.8 oz), last menstrual period 05/01/2015.  Physical Exam  Constitutional: She is oriented to person, place, and time. She appears well-developed and well-nourished.  obese  HENT:  Head: Normocephalic and atraumatic.  Neck: Normal range of motion. Neck supple.  Cardiovascular: Normal rate.   Respiratory: Effort normal.  GI: Soft. Bowel sounds are normal. She exhibits no distension and no mass. There is no tenderness. There is no rebound and no guarding.  Genitourinary:  deferred  Musculoskeletal: Normal range of motion.  Neurological: She is alert and oriented to person, place, and time.  Skin: Skin is warm and dry.  Psychiatric: She has a normal mood and affect.    MAU Course  Procedures  Imodium 2 mg po x1 Zofran 4 mg ODT x1 UA-WNL UPT-neg  Assessment and Plan  Viral gastroenteritis Intestinal cramping  Discharge home Zofran 4mg  ODT q8 hrs prn Imodium 2mg  po q4 hrs prn Levsin 0.125 mg SL q4 hrs prn BRAT diet Follow-up for worsening sx or no improvement over next 2-3 days  Laden Fieldhouse, N 05/11/2015, 1:46 AM

## 2015-10-11 ENCOUNTER — Encounter (HOSPITAL_BASED_OUTPATIENT_CLINIC_OR_DEPARTMENT_OTHER): Payer: Self-pay | Admitting: *Deleted

## 2015-10-11 ENCOUNTER — Emergency Department (HOSPITAL_BASED_OUTPATIENT_CLINIC_OR_DEPARTMENT_OTHER)
Admission: EM | Admit: 2015-10-11 | Discharge: 2015-10-11 | Disposition: A | Discharge status: Home / Self Care | Payer: 59 | Attending: Emergency Medicine | Admitting: Emergency Medicine

## 2015-10-11 ENCOUNTER — Emergency Department (HOSPITAL_BASED_OUTPATIENT_CLINIC_OR_DEPARTMENT_OTHER): Payer: 59

## 2015-10-11 DIAGNOSIS — R600 Localized edema: Secondary | ICD-10-CM | POA: Diagnosis not present

## 2015-10-11 DIAGNOSIS — M79645 Pain in left finger(s): Secondary | ICD-10-CM | POA: Insufficient documentation

## 2015-10-11 DIAGNOSIS — Z8742 Personal history of other diseases of the female genital tract: Secondary | ICD-10-CM | POA: Diagnosis not present

## 2015-10-11 MED ORDER — NAPROXEN 500 MG PO TABS: 500.0000 mg | ORAL_TABLET | Freq: Two times a day (BID) | ORAL | Status: DC

## 2015-10-11 MED ORDER — IBUPROFEN 800 MG PO TABS
800.0000 mg | ORAL_TABLET | Freq: Once | ORAL | Status: AC
  Administered 2015-10-11: 800 mg via ORAL
  Filled 2015-10-11: qty 1

## 2015-10-11 NOTE — ED Notes (Signed)
sm ice pack provided to pain area

## 2015-10-11 NOTE — ED Notes (Signed)
Pt teaching done re: application of splint, using ice and comfort along with pain meds, how to utilize ice for comfort measures as well. Opportunity for questions provided. Also provided name of MD to follow up with as per EDP orders

## 2015-10-11 NOTE — ED Notes (Signed)
Sore, tender, more at the base of the left thumb, describes as shooting pain, onset yesterday, denies any injury to area

## 2015-10-11 NOTE — ED Provider Notes (Signed)
CSN: [id number]     Arrival date & time 10/11/15  1324 History  By signing my name below, I, [name], attest that this documentation has been prepared under the direction and in the presence of Glynn Octave, MD . Electronically Signed: [name], Scribe. 10/11/2015. 10:44 AM.   Chief Complaint  Patient presents with  . Hand Pain   The history is provided by the patient. No language interpreter was used.     HPI Comments:  Stephanie Lee is a 39 y.o. female who presents to the Emergency Department complaining of atraumatic pain and swelling to the base of the left thumb which began yesterday. She reports increased pain with movement and palpation of the site; notes pain has progressively worsened since onset. Pt denies recent fall/ injury. She also denies h/o same and h/o arthirtis. Pt is right hand dominant. 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 No alleviating factors noted; no treatments tried PTA. Pt has no other complaints or symptoms at this time.    Past Medical History  Diagnosis Date  . PID (pelvic inflammatory disease) 2012   Past Surgical History  Procedure Laterality Date  . Cesarean section    . Dilation and curettage of uterus    . Tubal ligation    . Tonsillectomy     Family History  Problem Relation Age of Onset  . Arthritis Mother   . Hypertension Mother   . Arthritis Father   . Cancer Father   . Hypertension Sister   . Stroke Sister   . Kidney disease Sister   . Other Neg Hx    Social History  Substance Use Topics  . Smoking status: Never Smoker   . Smokeless tobacco: Never Used  . Alcohol Use: No   OB History    Gravida Para Term Preterm AB TAB SAB Ectopic Multiple Living   0 2 2 0 0 0 3     Review of Systems  Constitutional: Negative for fever and chills.  Respiratory: Negative for shortness of breath.   Cardiovascular: Negative for chest pain.  Musculoskeletal: Positive for myalgias and arthralgias.        Left hand    Allergies  Review of patient's allergies indicates no known allergies.  Home Medications   Prior to Admission medications   Medication Sig Start Date End Date Taking? Authorizing Provider  BORIC ACID EX Place 1 capsule vaginally at bedtime as needed. For "maintenance"     Historical Provider, MD  hyoscyamine (LEVSIN/SL) 0.125 MG SL tablet Place 1 tablet (0.125 mg total) under the tongue every 4 (four) hours as needed. 05/11/15   Lawernce Pitts, CNM  loperamide (IMODIUM A-D) 2 MG tablet Take 1 tablet (2 mg total) by mouth 4 (four) times daily as needed for diarrhea or loose stools. 05/11/15   Lawernce Pitts, CNM  naproxen (NAPROSYN) 500 MG tablet Take 1 tablet (500 mg total) by mouth 2 (two) times daily. 10/11/15   Glynn Octave, MD  ondansetron (ZOFRAN ODT) 4 MG disintegrating tablet Take 1 tablet (4 mg total) by mouth every 8 (eight) hours as needed for nausea or vomiting. 05/11/15   Lawernce Pitts, CNM   BP 130/99 mmHg  Pulse 80  Temp(Src) 98.2 F (36.8 C) (Oral)  Resp 18  Ht  (1.575 m)  Wt 221 lb (100.245 kg)  BMI 40.41 kg/m2  SpO2 97%  LMP 10/06/2015 (Approximate) Physical Exam  Constitutional: She is  oriented to person, place, and time. She appears well-developed and well-nourished. No distress.  HENT:  Head: Normocephalic and atraumatic.  Mouth/Throat: Oropharynx is clear and moist. No oropharyngeal exudate.  Eyes: Conjunctivae and EOM are normal. Pupils are equal, round, and reactive to light.  Neck: Normal range of motion. Neck supple.  No meningismus.  Cardiovascular: Normal rate, regular rhythm, normal heart sounds and intact distal pulses.   No murmur heard. Pulses:      Radial pulses are 2+ on the left side.  Pulmonary/Chest: Effort normal and breath sounds normal. No respiratory distress.  Abdominal: Soft. There is no tenderness. There is no rebound and no guarding.  Musculoskeletal: Normal range of motion. She exhibits no edema or  tenderness.  Left thumb TTP at MCP joint with reduced ROM  No erythema Intact radial pulse Able to oppose and extend thumb with pain Cap refill and sensation intact  Neurological: She is alert and oriented to person, place, and time. No cranial nerve deficit. She exhibits normal muscle tone. Coordination normal.  No ataxia on finger to nose bilaterally. No pronator drift. 5/5 strength throughout. CN 2-12 intact.Equal grip strength. Sensation intact.   Skin: Skin is warm.  Psychiatric: She has a normal mood and affect. Her behavior is normal.  Nursing note and vitals reviewed.   ED Course  Procedures   DIAGNOSTIC STUDIES:  Oxygen Saturation is 96% on RA, normal by my interpretation.    COORDINATION OF CARE:  10:08 AM Will order XR of the left hand.  Discussed treatment plan with pt at bedside and pt agreed to plan.  10:44 AM Pt updated with XR results.  Imaging Review Dg Hand Complete Left  10/11/2015  CLINICAL DATA:  39 year old female with pain in the left hand for the past 2 days. Pain in the distal aspect of the first metacarpal. EXAM: LEFT HAND - COMPLETE 3+ VIEW COMPARISON:  No priors. FINDINGS: Multiple views of the left hand demonstrate no acute displaced fracture, subluxation, dislocation, or soft tissue abnormality. IMPRESSION: No acute radiographic abnormality of the left hand. Electronically Signed   By: Trudie Reedaniel  Entrikin M.D.   On: 10/11/2015 10:30   I have personally reviewed and evaluated these images as part of my medical decision-making.   EKG Interpretation None      MDM   Final diagnoses:  Thumb pain, left   Atraumatic pain at base of left thumb for the past 2 days. No numbness or tingling. No breaks in skin.  Neurovascular intact. Reduced range of motion due to pain. No evidence of septic joint. X-rays negative for fracture. 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 No pain at snuff box. Patient able to range thumb reduced range of  motion due to pain. We'll treat conservatively with splint, NSAIDs, follow-up with sports medicine.  I personally performed the services described in this documentation, which was scribed in my presence. The recorded information has been reviewed and is accurate.    Glynn OctaveStephen Freddy Spadafora, MD 10/11/15 1113

## 2015-10-11 NOTE — ED Notes (Signed)
Patient transported to X-ray 

## 2015-10-11 NOTE — ED Notes (Signed)
Splint on Left wrist per EDP orders

## 2015-10-11 NOTE — Discharge Instructions (Signed)

## 2016-03-26 ENCOUNTER — Other Ambulatory Visit: Payer: Self-pay | Admitting: Obstetrics and Gynecology

## 2016-03-26 DIAGNOSIS — R921 Mammographic calcification found on diagnostic imaging of breast: Secondary | ICD-10-CM

## 2016-04-01 ENCOUNTER — Ambulatory Visit
Admission: RE | Admit: 2016-04-01 | Discharge: 2016-04-01 | Disposition: A | Discharge status: Home / Self Care | Payer: 59 | Source: Ambulatory Visit | Attending: Obstetrics and Gynecology | Admitting: Obstetrics and Gynecology

## 2016-04-01 DIAGNOSIS — R921 Mammographic calcification found on diagnostic imaging of breast: Secondary | ICD-10-CM

## 2016-07-30 ENCOUNTER — Encounter (HOSPITAL_BASED_OUTPATIENT_CLINIC_OR_DEPARTMENT_OTHER): Payer: Self-pay | Admitting: *Deleted

## 2016-07-30 ENCOUNTER — Emergency Department (HOSPITAL_BASED_OUTPATIENT_CLINIC_OR_DEPARTMENT_OTHER)
Admission: EM | Admit: 2016-07-30 | Discharge: 2016-07-30 | Disposition: A | Discharge status: Home / Self Care | Payer: 59 | Attending: Emergency Medicine | Admitting: Emergency Medicine

## 2016-07-30 ENCOUNTER — Emergency Department (HOSPITAL_BASED_OUTPATIENT_CLINIC_OR_DEPARTMENT_OTHER): Payer: 59

## 2016-07-30 DIAGNOSIS — M25522 Pain in left elbow: Secondary | ICD-10-CM | POA: Insufficient documentation

## 2016-07-30 MED ORDER — IBUPROFEN 800 MG PO TABS: 800.0000 mg | ORAL_TABLET | Freq: Three times a day (TID) | ORAL | 0 refills | Status: DC | PRN

## 2016-07-30 MED ORDER — HYDROCODONE-ACETAMINOPHEN 5-325 MG PO TABS
1.0000 | ORAL_TABLET | Freq: Once | ORAL | Status: AC
  Administered 2016-07-30: 1 via ORAL
  Filled 2016-07-30: qty 1

## 2016-07-30 MED ORDER — HYDROCODONE-ACETAMINOPHEN 5-325 MG PO TABS: 1.0000 | ORAL_TABLET | Freq: Four times a day (QID) | ORAL | 0 refills | Status: DC | PRN

## 2016-07-30 NOTE — ED Triage Notes (Signed)
States 2 days ago she was reaching for her grandson and had a sudden pain in her left elbow. Since that time certain ways she moves causes pain in her elbow with radiation into her upper arm. Pain is worse at night.

## 2016-07-30 NOTE — Discharge Instructions (Signed)
Ibuprofen as needed for mild-to-moderate pain. Take pain medication only for severe pain- This can make you very drowsy - please do not drink alcohol, operate heavy machinery or drive on this medication. Ice affected area for additional pain relief. If no improvement on Monday, please call the sports medicine physician to schedule follow-up appointment. Return to ER for new or worsening symptoms, any additional concerns.  COLD THERAPY DIRECTIONS:  Ice or gel packs can be used to reduce both pain and swelling. Ice is the most helpful within the first 24 to 48 hours after an injury or flareup from overusing a muscle or joint.  Ice is effective, has very few side effects, and is safe for most people to use.   If you expose your skin to cold temperatures for too long or without the proper protection, you can damage your skin or nerves. Watch for signs of skin damage due to cold.   HOME CARE INSTRUCTIONS  Follow these tips to use ice and cold packs safely.  Place a dry or damp towel between the ice and skin. A damp towel will cool the skin more quickly, so you may need to shorten the time that the ice is used.  For a more rapid response, add gentle compression to the ice.  Ice for no more than 10 to 20 minutes at a time. The bonier the area you are icing, the less time it will take to get the benefits of ice.  Check your skin after 5 minutes to make sure there are no signs of a poor response to cold or skin damage.  Rest 20 minutes or more in between uses.  Once your skin is numb, you can end your treatment. You can test numbness by very lightly touching your skin. The touch should be so light that you do not see the skin dimple from the pressure of your fingertip. When using ice, most people will feel these normal sensations in this order: cold, burning, aching, and numbness.

## 2016-07-30 NOTE — ED Provider Notes (Signed)
[facility] Provider Note   CSN: [id number] Arrival date & time: 07/30/16  1931  By signing my name below, I, Emmanuella Mensah, attest that this documentation has been prepared under the direction and in the presence of [facility], PA-C. Electronically Signed: Angelene Giovanni, ED Scribe. 07/30/16. 9:25 PM.    History   Chief Complaint Chief Complaint  Patient presents with  . Elbow Pain   HPI Comments: Stephanie Lee is a 40 y.o. female who presents to the Emergency Department complaining of sudden onset of ongoing moderate elbow pain that radiates up towards her upper arm onset 2 days ago. She explains that she was reaching out to her grandson when she straightened her arm prior to onset of the pain. She reports associated intermittent episodes of spasm pain and mild swelling to the left elbow. No alleviating factors noted. She states that she has tried Ibuprofen with no relief. She denies any fever, chills, generalized rash, or any open wounds.    The history is provided by the patient. No language interpreter was used.    Past Medical History:  Diagnosis Date  . PID (pelvic inflammatory disease) 2012    Patient Active Problem List   Diagnosis Date Noted  . PID (acute pelvic inflammatory disease) 08/15/2011    Class: Acute  . Acute pelvic pain, female 08/14/2011    Past Surgical History:  Procedure Laterality Date  . CESAREAN SECTION    . DILATION AND CURETTAGE OF UTERUS    . TONSILLECTOMY    . TUBAL LIGATION      OB History    Gravida Para Term Preterm AB Living   5 3 3  0 2 3   SAB TAB Ectopic Multiple Live Births   0 2 0 0         Home Medications    Prior to Admission medications   Medication Sig Start Date End Date Taking? Authorizing Provider  BORIC ACID EX Place 1 capsule vaginally at bedtime as needed. For "maintenance"     Historical Provider, MD  HYDROcodone-acetaminophen (NORCO/VICODIN) 5-325 MG tablet Take 1 tablet by mouth every 6  (six) hours as needed for severe pain. 07/30/16   Chase Picket Pius Byrom, PA-C  hyoscyamine (LEVSIN/SL) 0.125 MG SL tablet Place 1 tablet (0.125 mg total) under the tongue every 4 (four) hours as needed. 05/11/15   Donette Larry, CNM  ibuprofen (ADVIL,MOTRIN) 800 MG tablet Take 1 tablet (800 mg total) by mouth every 8 (eight) hours as needed. 07/30/16   Chase Picket Castulo Scarpelli, PA-C  loperamide (IMODIUM A-D) 2 MG tablet Take 1 tablet (2 mg total) by mouth 4 (four) times daily as needed for diarrhea or loose stools. 05/11/15   Donette Larry, CNM  naproxen (NAPROSYN) 500 MG tablet Take 1 tablet (500 mg total) by mouth 2 (two) times daily. 10/11/15   Glynn Octave, MD  ondansetron (ZOFRAN ODT) 4 MG disintegrating tablet Take 1 tablet (4 mg total) by mouth every 8 (eight) hours as needed for nausea or vomiting. 05/11/15   Donette Larry, CNM    Family History Family History  Problem Relation Age of Onset  . Arthritis Mother   . Hypertension Mother   . Arthritis Father   . Cancer Father   . Hypertension Sister   . Stroke Sister   . Kidney disease Sister   . Other Neg Hx     Social History Social History  Substance Use Topics  . Smoking status: Never Smoker  . Smokeless tobacco: Never  Used  . Alcohol use No     Allergies   Review of patient's allergies indicates no known allergies.   Review of Systems Review of Systems  Constitutional: Negative for chills and fever.  Musculoskeletal: Positive for arthralgias and joint swelling.  Skin: Negative for rash and wound.     Physical Exam Updated Vital Signs BP 133/87 (BP Location: Right Arm)   Pulse 73   Temp 98.2 F (36.8 C) (Oral)   Resp 16   Ht 5\' 2"  (1.575 m)   Wt 105.2 kg   LMP 07/04/2016   SpO2 99%   BMI 42.43 kg/m   Physical Exam  Constitutional: She is oriented to person, place, and time. She appears well-developed and well-nourished. No distress.  HENT:  Head: Normocephalic and atraumatic.  Cardiovascular: Normal rate,  regular rhythm, normal heart sounds and intact distal pulses.  Exam reveals no gallop and no friction rub.   No murmur heard. Pulmonary/Chest: Effort normal and breath sounds normal. No respiratory distress. She has no wheezes. She has no rales. She exhibits no tenderness.  Abdominal: Soft. Bowel sounds are normal. She exhibits no distension. There is no tenderness.  Musculoskeletal: She exhibits tenderness. She exhibits no edema.  LUE: full ROM, pain with flexion and extension of elbow, TTP of elbow and along triceps. 2+ radial pulse Sensation Intact in ulnar, medial, and radial nerve distribution 5/5 strength including grip strength  Neurological: She is alert and oriented to person, place, and time.  Skin: Skin is warm and dry.  Nursing note and vitals reviewed.    ED Treatments / Results  DIAGNOSTIC STUDIES: Oxygen Saturation is 99% on RA, normal by my interpretation.    COORDINATION OF CARE: 9:19 PM- Pt advised of plan for treatment and pt agrees. Pt will receive x-ray for further evaluation. She will receive pain medication.    Labs (all labs ordered are listed, but only abnormal results are displayed) Labs Reviewed - No data to display  EKG  EKG Interpretation None       Radiology Dg Elbow Complete Left  Result Date: 07/30/2016 CLINICAL DATA:  Twisted elbow reaching for grandson. Felt a pop. Limited range of motion for 3 days. EXAM: LEFT ELBOW - COMPLETE 3+ VIEW COMPARISON:  None. FINDINGS: There is no evidence of fracture, dislocation, or joint effusion. There is no evidence of arthropathy or other focal bone abnormality. Soft tissues are unremarkable. IMPRESSION: Negative. Electronically Signed   By: Awilda Metroourtnay  Bloomer M.D.   On: 07/30/2016 22:07    Procedures Procedures (including critical care time)  Medications Ordered in ED Medications  HYDROcodone-acetaminophen (NORCO/VICODIN) 5-325 MG per tablet 1 tablet (1 tablet Oral Given 07/30/16 2144)     Initial  Impression / Assessment and Plan / ED Course  Elizabeth SauerJaime Giovoni Bunch, PA-C has reviewed the triage vital signs and the nursing notes.  Pertinent labs & imaging results that were available during my care of the patient were reviewed by me and considered in my medical decision making (see chart for details).  Clinical Course   Stephanie Durhamonya Megill is an otherwise healthy 40 y.o. female who presents to ED for acute onset of left elbow pain x 2 days. TTP of elbow and tricep on exam. LUE is NVI. X-ray was obtained and unremarkable. Symptomatic home care instructions discussed. FOLLOW-up if no improvement. All questions answered.  Final Clinical Impressions(s) / ED Diagnoses   Final diagnoses:  Elbow pain, left    New Prescriptions Discharge Medication List as of 07/30/2016 10:44  PM    START taking these medications   Details  HYDROcodone-acetaminophen (NORCO/VICODIN) 5-325 MG tablet Take 1 tablet by mouth every 6 (six) hours as needed for severe pain., Starting Fri 07/30/2016, Print    ibuprofen (ADVIL,MOTRIN) 800 MG tablet Take 1 tablet (800 mg total) by mouth every 8 (eight) hours as needed., Starting Fri 07/30/2016, Print       I personally performed the services described in this documentation, which was scribed in my presence. The recorded information has been reviewed and is accurate.    [facility] Morissa Obeirne, PA-C 07/31/16 0004    Arby Barrette, MD 07/31/16 1415

## 2017-01-21 ENCOUNTER — Encounter (HOSPITAL_COMMUNITY): Payer: Self-pay

## 2017-01-21 ENCOUNTER — Ambulatory Visit (HOSPITAL_COMMUNITY)
Admission: EM | Admit: 2017-01-21 | Discharge: 2017-01-21 | Disposition: A | Discharge status: Home / Self Care | Payer: 59 | Attending: Family Medicine | Admitting: Family Medicine

## 2017-01-21 DIAGNOSIS — M779 Enthesopathy, unspecified: Secondary | ICD-10-CM

## 2017-01-21 MED ORDER — MELOXICAM 15 MG PO TABS: 15.0000 mg | ORAL_TABLET | Freq: Every day | ORAL | 0 refills | Status: DC

## 2017-01-21 NOTE — ED Provider Notes (Signed)
CSN: [id number]     Arrival date & time 01/21/17  1035 History   First MD Initiated Contact with Patient 01/21/17 1157     Chief Complaint  Patient presents with  . Hand Pain   (Consider location/radiation/quality/duration/timing/severity/associated sxs/prior Treatment) 41 year old female presents to clinic with history of 24 hours of left hand pain. She denies any history of trauma, has not lifted anything heavy, however she does work in an office, doing billing and data entry. There is no redness, she denies any fever, nausea, or other symptoms that could indicate systemic disease. Pain is described as a dull ache, as well as a tingling in the hand and fingers.   The history is provided by the patient.  Hand Pain     Past Medical History:  Diagnosis Date  . PID (pelvic inflammatory disease) 2012   Past Surgical History:  Procedure Laterality Date  . CESAREAN SECTION    . DILATION AND CURETTAGE OF UTERUS    . TONSILLECTOMY    . TUBAL LIGATION     Family History  Problem Relation Age of Onset  . Arthritis Mother   . Hypertension Mother   . Arthritis Father   . Cancer Father   . Hypertension Sister   . Stroke Sister   . Kidney disease Sister   . Other Neg Hx    Social History  Substance Use Topics  . Smoking status: Never Smoker  . Smokeless tobacco: Never Used  . Alcohol use No   OB History    Gravida Para Term Preterm AB Living   5 3 3  0 2 3   SAB TAB Ectopic Multiple Live Births   0 2 0 0       Review of Systems  Reason unable to perform ROS: as covered in HPI.  All other systems reviewed and are negative.   Allergies  Patient has no known allergies.  Home Medications   Prior to Admission medications   Medication Sig Start Date End Date Taking? Authorizing Provider  BORIC ACID EX Place 1 capsule vaginally at bedtime as needed. For "maintenance"     Historical Provider, MD  HYDROcodone-acetaminophen (NORCO/VICODIN) 5-325 MG tablet Take 1 tablet by  mouth every 6 (six) hours as needed for severe pain. 07/30/16   Chase PicketJaime Pilcher Ward, PA-C  hyoscyamine (LEVSIN/SL) 0.125 MG SL tablet Place 1 tablet (0.125 mg total) under the tongue every 4 (four) hours as needed. 05/11/15   Donette LarryMelanie Bhambri, CNM  ibuprofen (ADVIL,MOTRIN) 800 MG tablet Take 1 tablet (800 mg total) by mouth every 8 (eight) hours as needed. 07/30/16   Chase PicketJaime Pilcher Ward, PA-C  loperamide (IMODIUM A-D) 2 MG tablet Take 1 tablet (2 mg total) by mouth 4 (four) times daily as needed for diarrhea or loose stools. 05/11/15   Donette LarryMelanie Bhambri, CNM  meloxicam (MOBIC) 15 MG tablet Take 1 tablet (15 mg total) by mouth daily. 01/21/17   Dorena BodoLawrence Ambrose Wile, NP  naproxen (NAPROSYN) 500 MG tablet Take 1 tablet (500 mg total) by mouth 2 (two) times daily. 10/11/15   Glynn OctaveStephen Rancour, MD  ondansetron (ZOFRAN ODT) 4 MG disintegrating tablet Take 1 tablet (4 mg total) by mouth every 8 (eight) hours as needed for nausea or vomiting. 05/11/15   Donette LarryMelanie Bhambri, CNM   Meds Ordered and Administered this Visit  Medications - No data to display  BP 133/73 (BP Location: Left Arm)   Pulse 64   Temp 98.9 F (37.2 C) (Oral)   Resp 20  LMP 12/31/2016 (Exact Date)   SpO2 99%  No data found.   Physical Exam  Constitutional: She is oriented to person, place, and time. She appears well-developed and well-nourished. No distress.  HENT:  Head: Normocephalic and atraumatic.  Musculoskeletal:       Hands: Neurological: She is oriented to person, place, and time. A cranial nerve deficit is present.  Skin: Skin is warm and dry. Capillary refill takes less than 2 seconds. No rash noted. She is not diaphoretic. No erythema. No pallor.  Psychiatric: She has a normal mood and affect.  Nursing note and vitals reviewed.   Urgent Care Course     Procedures (including critical care time)  Labs Review Labs Reviewed - No data to display  Imaging Review No results found.   Visual Acuity Review  Right Eye Distance:     Left Eye Distance:   Bilateral Distance:    Right Eye Near:   Left Eye Near:    Bilateral Near:         MDM   1. Tendonitis   Based on her signs and symptoms, and physical exam, I believe you most likely have tendinitis. I prescribed a medication called Mobic, take one tablet once a day. I also recommend you obtain a wrist splint available over-the-counter at any drugstore, [employer], [employer], etc. I recommend he wear this splint at night by you sleep. At any time throughout the day when not interfering with you work. Should your symptoms fail to improve and recommend he follow-up with an orthopedic physician, or your primary care provider     Dorena Bodo, NP 01/21/17 [id number]

## 2017-01-21 NOTE — Discharge Instructions (Signed)
Based on her signs and symptoms, and physical exam, I believe you most likely have tendinitis. I prescribed a medication called Mobic, take one tablet once a day. I also recommend you obtain a wrist splint available over-the-counter at any drugstore, [employer], [employer], etc. I recommend he wear this splint at night by you sleep. At any time throughout the day when not interfering with you work. Should your symptoms fail to improve and recommend he follow-up with an orthopedic physician, or your primary care provider

## 2017-01-21 NOTE — ED Triage Notes (Signed)
Pt having left hand pain since yesterday, did take 800 ibuprofen. Said it's swollen, hurt to put pressure, lift or ball a fist. Said she didn't injure it.

## 2017-03-08 ENCOUNTER — Encounter: Payer: Self-pay | Admitting: Sports Medicine

## 2017-03-08 ENCOUNTER — Ambulatory Visit (INDEPENDENT_AMBULATORY_CARE_PROVIDER_SITE_OTHER): Payer: 59 | Admitting: Sports Medicine

## 2017-03-08 VITALS — BP 138/85 | HR 74 | Resp 14

## 2017-03-08 DIAGNOSIS — B353 Tinea pedis: Secondary | ICD-10-CM

## 2017-03-08 DIAGNOSIS — M79671 Pain in right foot: Secondary | ICD-10-CM

## 2017-03-08 MED ORDER — TERBINAFINE HCL 1 % EX SOLN: CUTANEOUS | 2 refills | Status: DC

## 2017-03-08 NOTE — Progress Notes (Signed)
   Subjective:    Patient ID: Stephanie Lee, female    DOB: 12/03/1976, 41 y.o.   MRN: [id number]  HPI    Review of Systems  All other systems reviewed and are negative.      Objective:   Physical Exam        Assessment & Plan:

## 2017-03-08 NOTE — Progress Notes (Signed)
Subjective: Stephanie Lee is a 41 y.o. female patient who presents to office for evaluation of Right foot pain secondary to itchy patch of skin x 1 year. Patient states very itchy and now raw in area and has seen 2 dermatologist and was given cream and pill; don't remember names of medication with no relief. Patient denies any other pedal complaints.   ROS per nurse note  Patient Active Problem List   Diagnosis Date Noted  . PID (acute pelvic inflammatory disease) 08/15/2011    Class: Acute  . Acute pelvic pain, female 08/14/2011    Current Outpatient Prescriptions on File Prior to Visit  Medication Sig Dispense Refill  . BORIC ACID EX Place 1 capsule vaginally at bedtime as needed. For "maintenance"     . HYDROcodone-acetaminophen (NORCO/VICODIN) 5-325 MG tablet Take 1 tablet by mouth every 6 (six) hours as needed for severe pain. (Patient not taking: Reported on 03/08/2017) 10 tablet 0  . hyoscyamine (LEVSIN/SL) 0.125 MG SL tablet Place 1 tablet (0.125 mg total) under the tongue every 4 (four) hours as needed. (Patient not taking: Reported on 03/08/2017) 20 tablet 0  . ibuprofen (ADVIL,MOTRIN) 800 MG tablet Take 1 tablet (800 mg total) by mouth every 8 (eight) hours as needed. (Patient not taking: Reported on 03/08/2017) 21 tablet 0  . loperamide (IMODIUM A-D) 2 MG tablet Take 1 tablet (2 mg total) by mouth 4 (four) times daily as needed for diarrhea or loose stools. (Patient not taking: Reported on 03/08/2017) 20 tablet 0  . meloxicam (MOBIC) 15 MG tablet Take 1 tablet (15 mg total) by mouth daily. (Patient not taking: Reported on 03/08/2017) 30 tablet 0  . naproxen (NAPROSYN) 500 MG tablet Take 1 tablet (500 mg total) by mouth 2 (two) times daily. (Patient not taking: Reported on 03/08/2017) 30 tablet 0  . ondansetron (ZOFRAN ODT) 4 MG disintegrating tablet Take 1 tablet (4 mg total) by mouth every 8 (eight) hours as needed for nausea or vomiting. (Patient not taking: Reported on 03/08/2017) 20 tablet 0    No current facility-administered medications on file prior to visit.     No Known Allergies  Objective:  General: Alert and oriented x3 in no acute distress  Dermatology: Erythematous patch platar medial forefoot and scaly skin in between toes resembling tina on right with no signs of infection, no webspace macerations, no ecchymosis bilateral, all nails x 10 are well manicured.  Vascular: Dorsalis Pedis and Posterior Tibial pedal pulses 2/4, Capillary Fill Time 3 seconds, + pedal hair growth bilateral, no edema bilateral lower extremities, Temperature gradient within normal limits.  Neurology: Gross sensation intact via light touch bilateral.  Musculoskeletal: No tenderness to right foot, subjective itchy sensation, Muscular strength 5/5 in all groups without pain or limitation on range of motion. No lower extremity muscular or boney deformity noted.  Assessment and Plan: Problem List Items Addressed This Visit    None    Visit Diagnoses    Tinea pedis, right    -  Primary   Relevant Medications   Terbinafine HCl (LAMISIL AT SPRAY) 1 % SOLN   Foot pain, right          -Complete examination performed -Discussed treatment options for tinea vs lichen planus  -Rx Lamisil spray -Encouraged daily skin emollients and good hygiene habits -Advised patient to all office with names of other creams that she used in past from dermatologist so I can decide if she needs an additional Rx  -Patient to return to  office as needed or sooner if condition worsens.  Landis Martins, DPM

## 2017-03-09 ENCOUNTER — Telehealth: Payer: Self-pay | Admitting: *Deleted

## 2017-03-09 NOTE — Telephone Encounter (Signed)
Pt states Dr. Marylene Land had wanted her to call with the medication prescribed by the dermatogist - Solodyn  extended release tablet, Fluocinonide 0.05% cream, Naftin 2% gel.

## 2017-03-09 NOTE — Telephone Encounter (Signed)
Ok thanks. Let the patient know to continue the lamisil spray if no improvement in 4-6 weeks to call office to let me know so we can try a different medication. -Dr. Marylene Land

## 2017-06-07 ENCOUNTER — Ambulatory Visit (INDEPENDENT_AMBULATORY_CARE_PROVIDER_SITE_OTHER): Payer: 59 | Admitting: Sports Medicine

## 2017-06-07 ENCOUNTER — Ambulatory Visit (INDEPENDENT_AMBULATORY_CARE_PROVIDER_SITE_OTHER): Payer: 59

## 2017-06-07 ENCOUNTER — Encounter: Payer: Self-pay | Admitting: Sports Medicine

## 2017-06-07 DIAGNOSIS — B353 Tinea pedis: Secondary | ICD-10-CM

## 2017-06-07 DIAGNOSIS — M79671 Pain in right foot: Secondary | ICD-10-CM | POA: Diagnosis not present

## 2017-06-07 DIAGNOSIS — M779 Enthesopathy, unspecified: Secondary | ICD-10-CM

## 2017-06-07 MED ORDER — TRIAMCINOLONE ACETONIDE 10 MG/ML IJ SUSP: 10.0000 mg | Freq: Once | INTRAMUSCULAR | Status: DC

## 2017-06-07 NOTE — Progress Notes (Signed)
Subjective: Stephanie Lee is a 41 y.o. female patient who returns to office for follow up evaluation of Right foot pain. Patient states that the bottom of her foot is still very itchy; Lamisil spray did not help. Admits now has a new problem of pain and swelling under 2-3 toes that started 1.5 weeks ago. Does not recall trauma or injury. Denies any other pedal complains.   Patient Active Problem List   Diagnosis Date Noted  . PID (acute pelvic inflammatory disease) 08/15/2011    Class: Acute  . Acute pelvic pain, female 08/14/2011   Current Outpatient Prescriptions on File Prior to Visit  Medication Sig Dispense Refill  . BORIC ACID EX Place 1 capsule vaginally at bedtime as needed. For "maintenance"     . HYDROcodone-acetaminophen (NORCO/VICODIN) 5-325 MG tablet Take 1 tablet by mouth every 6 (six) hours as needed for severe pain. (Patient not taking: Reported on 03/08/2017) 10 tablet 0  . hyoscyamine (LEVSIN/SL) 0.125 MG SL tablet Place 1 tablet (0.125 mg total) under the tongue every 4 (four) hours as needed. (Patient not taking: Reported on 03/08/2017) 20 tablet 0  . ibuprofen (ADVIL,MOTRIN) 800 MG tablet Take 1 tablet (800 mg total) by mouth every 8 (eight) hours as needed. (Patient not taking: Reported on 03/08/2017) 21 tablet 0  . loperamide (IMODIUM A-D) 2 MG tablet Take 1 tablet (2 mg total) by mouth 4 (four) times daily as needed for diarrhea or loose stools. (Patient not taking: Reported on 03/08/2017) 20 tablet 0  . meloxicam (MOBIC) 15 MG tablet Take 1 tablet (15 mg total) by mouth daily. (Patient not taking: Reported on 03/08/2017) 30 tablet 0  . naproxen (NAPROSYN) 500 MG tablet Take 1 tablet (500 mg total) by mouth 2 (two) times daily. (Patient not taking: Reported on 03/08/2017) 30 tablet 0  . ondansetron (ZOFRAN ODT) 4 MG disintegrating tablet Take 1 tablet (4 mg total) by mouth every 8 (eight) hours as needed for nausea or vomiting. (Patient not taking: Reported on 03/08/2017) 20 tablet 0   . Terbinafine HCl (LAMISIL AT SPRAY) 1 % SOLN Spray to feet at night 125 mL 2   No current facility-administered medications on file prior to visit.    No Known Allergies  Objective:  General: Alert and oriented x3 in no acute distress  Dermatology: Itchy dry patch of skin sub met 1 on right, No open lesions bilateral lower extremities, no webspace macerations, no ecchymosis bilateral, all nails x 10 are well manicured.  Neurovascular: Intact. No lower extremity edema bilateral.   Musculoskeletal:There is pain with palpation sub met 2-3 on right with direct palpation and with extension of toes, No pain with calf compression bilateral. Strength within normal limits in all groups bilateral.   Assessment and Plan: Problem List Items Addressed This Visit    None    Visit Diagnoses    Right foot pain    -  Primary   Relevant Medications   triamcinolone acetonide (KENALOG) 10 MG/ML injection 10 mg (Start on 06/07/2017 11:30 PM)   Other Relevant Orders   DG Foot Complete Right (Completed)   Tinea pedis, right       Relevant Orders   Dermatology pathology   Capsulitis       Relevant Medications   triamcinolone acetonide (KENALOG) 10 MG/ML injection 10 mg (Start on 06/07/2017 11:30 PM)      -Complete examination performed -Discussed treatement options -After oral consent and aseptic prep, injected a mixture containing 1 ml of 2%  plain lidocaine, 1 ml 0.5% plain marcaine, 0.5 ml of kenalog 10 and 0.5 ml of dexamethasone phosphate into right sub met 2 without complication. Post-injection care discussed with patient.  -After oral consent and local block and aseptic prep a 33m punch biopsy x2 was obtained and sent to [facility] and area was dressed with antibiotic cream, offloading pad, and coban dressing -Advised patient to apply antibiotic cream daily and redress with offloading pads and bandaid -Rx post op shoe -Patient to return to office 2-3 weeks for review of pathology and follow up on  capsulitis or sooner if condition worsens.  TLandis Martins DPM

## 2017-06-21 ENCOUNTER — Telehealth: Payer: Self-pay | Admitting: Sports Medicine

## 2017-06-21 NOTE — Telephone Encounter (Signed)
I informed pt biopsy results were available and Dr. Marylene LandStover would like to go over them with her at her appt 06/28/2017. Pt states understanding.

## 2017-06-21 NOTE — Telephone Encounter (Signed)
I was calling to see if my biopsy results have come in and if so, can you go over them with me on the phone.

## 2017-06-23 ENCOUNTER — Other Ambulatory Visit: Payer: Self-pay | Admitting: Obstetrics and Gynecology

## 2017-06-23 DIAGNOSIS — Z1231 Encounter for screening mammogram for malignant neoplasm of breast: Secondary | ICD-10-CM

## 2017-06-28 ENCOUNTER — Ambulatory Visit (INDEPENDENT_AMBULATORY_CARE_PROVIDER_SITE_OTHER): Payer: 59 | Admitting: Sports Medicine

## 2017-06-28 DIAGNOSIS — M79671 Pain in right foot: Secondary | ICD-10-CM

## 2017-06-28 DIAGNOSIS — L280 Lichen simplex chronicus: Secondary | ICD-10-CM | POA: Diagnosis not present

## 2017-06-28 MED ORDER — UREA 40 % EX OINT: TOPICAL_OINTMENT | CUTANEOUS | 0 refills | Status: DC | PRN

## 2017-06-28 MED ORDER — BETAMETHASONE DIPROPIONATE 0.05 % EX CREA: TOPICAL_CREAM | Freq: Two times a day (BID) | CUTANEOUS | 0 refills | Status: DC

## 2017-06-28 NOTE — Progress Notes (Signed)
Subjective: Stephanie Lee is a 41 y.o. female patient who returns to office for follow up evaluation of Right foot biopsy site and to discuss results. Reports area has healed and is still very itchy. Denies any other pedal complains.   Patient Active Problem List   Diagnosis Date Noted  . PID (acute pelvic inflammatory disease) 08/15/2011    Class: Acute  . Acute pelvic pain, female 08/14/2011   Current Outpatient Prescriptions on File Prior to Visit  Medication Sig Dispense Refill  . BORIC ACID EX Place 1 capsule vaginally at bedtime as needed. For "maintenance"     . HYDROcodone-acetaminophen (NORCO/VICODIN) 5-325 MG tablet Take 1 tablet by mouth every 6 (six) hours as needed for severe pain. (Patient not taking: Reported on 03/08/2017) 10 tablet 0  . hyoscyamine (LEVSIN/SL) 0.125 MG SL tablet Place 1 tablet (0.125 mg total) under the tongue every 4 (four) hours as needed. (Patient not taking: Reported on 03/08/2017) 20 tablet 0  . ibuprofen (ADVIL,MOTRIN) 800 MG tablet Take 1 tablet (800 mg total) by mouth every 8 (eight) hours as needed. (Patient not taking: Reported on 03/08/2017) 21 tablet 0  . loperamide (IMODIUM A-D) 2 MG tablet Take 1 tablet (2 mg total) by mouth 4 (four) times daily as needed for diarrhea or loose stools. (Patient not taking: Reported on 03/08/2017) 20 tablet 0  . meloxicam (MOBIC) 15 MG tablet Take 1 tablet (15 mg total) by mouth daily. (Patient not taking: Reported on 03/08/2017) 30 tablet 0  . naproxen (NAPROSYN) 500 MG tablet Take 1 tablet (500 mg total) by mouth 2 (two) times daily. (Patient not taking: Reported on 03/08/2017) 30 tablet 0  . ondansetron (ZOFRAN ODT) 4 MG disintegrating tablet Take 1 tablet (4 mg total) by mouth every 8 (eight) hours as needed for nausea or vomiting. (Patient not taking: Reported on 03/08/2017) 20 tablet 0  . Terbinafine HCl (LAMISIL AT SPRAY) 1 % SOLN Spray to feet at night 125 mL 2   Current Facility-Administered Medications on File Prior to  Visit  Medication Dose Route Frequency Provider Last Rate Last Dose  . triamcinolone acetonide (KENALOG) 10 MG/ML injection 10 mg  10 mg Other Once Landis Martins, DPM       No Known Allergies  Objective:  General: Alert and oriented x3 in no acute distress  Dermatology: Healed biopsy site with Itchy dry patch of skin sub met 1 on right, No open lesions bilateral lower extremities, no webspace macerations, no ecchymosis bilateral, all nails x 10 are well manicured.  Neurovascular: Intact. No lower extremity edema bilateral.   Musculoskeletal:There is no pain with palpation sub met 2-3 on right with direct palpation and with extension of toes resolved after steroid shot, No pain with calf compression bilateral. Strength within normal limits in all groups bilateral.   Assessment and Plan: Problem List Items Addressed This Visit    None    Visit Diagnoses    Lichen simplex chronicus    -  Primary   Right foot pain          -Complete examination performed -Discussed treatement options for + biopsy of skin reveal lichen simplex chronicus  -Biopsy site well healed  -Rx Bethamethasone and urea 40 to use as instructed  -Recommend good supportive shoes  -Patient to return to office 3 weeks for med check or sooner if condition worsens.  Landis Martins, DPM

## 2017-06-29 ENCOUNTER — Telehealth: Payer: Self-pay | Admitting: Sports Medicine

## 2017-06-29 NOTE — Telephone Encounter (Signed)
Yes, I saw Dr. Marylene LandStover yesterday and she prescribed me an ointment and a cream but when I picked up the prescriptions from the pharmacy and opened the box they were both the same, not one cream and one ointment.

## 2017-06-29 NOTE — Telephone Encounter (Signed)
I informed pt it was fine for the prescriptions to both be ointments and pt stated they were both cream. I told pt on occasion a ointment is not in production for the urea, but the cream is available so the cream is given. Pt asked if she still needed to wrap her foot in saran wrap and I told her to continue with the orders Dr. Marylene LandStover gave her.

## 2017-07-01 ENCOUNTER — Ambulatory Visit
Admission: RE | Admit: 2017-07-01 | Discharge: 2017-07-01 | Disposition: A | Discharge status: Home / Self Care | Payer: 59 | Source: Ambulatory Visit | Attending: Obstetrics and Gynecology | Admitting: Obstetrics and Gynecology

## 2017-07-01 DIAGNOSIS — Z1231 Encounter for screening mammogram for malignant neoplasm of breast: Secondary | ICD-10-CM

## 2017-07-26 ENCOUNTER — Ambulatory Visit: Payer: 59 | Admitting: Sports Medicine

## 2017-08-02 ENCOUNTER — Encounter: Payer: Self-pay | Admitting: Sports Medicine

## 2017-08-02 ENCOUNTER — Ambulatory Visit (INDEPENDENT_AMBULATORY_CARE_PROVIDER_SITE_OTHER): Payer: 59 | Admitting: Sports Medicine

## 2017-08-02 DIAGNOSIS — M79671 Pain in right foot: Secondary | ICD-10-CM | POA: Diagnosis not present

## 2017-08-02 DIAGNOSIS — L280 Lichen simplex chronicus: Secondary | ICD-10-CM | POA: Diagnosis not present

## 2017-08-02 MED ORDER — DIPHENHYDRAMINE HCL 50 MG PO TABS: 50.0000 mg | ORAL_TABLET | Freq: Every evening | ORAL | 0 refills | Status: DC | PRN

## 2017-08-02 NOTE — Progress Notes (Signed)
Subjective: Stephanie Lee is a 41 y.o. female patient who returns to office for follow up evaluation of Right foot biopsy site and lichen simplex. Reports still itchy sensation to area has been using the betamethasone and urea cream that has helped a little but not completely gone. States now she is getting a itchy area on her thumb on right. Admits to some stressors at home. Denies any other pedal complains.   Patient Active Problem List   Diagnosis Date Noted  . PID (acute pelvic inflammatory disease) 08/15/2011    Class: Acute  . Acute pelvic pain, female 08/14/2011   Current Outpatient Prescriptions on File Prior to Visit  Medication Sig Dispense Refill  . betamethasone dipropionate (DIPROLENE) 0.05 % cream Apply topically 2 (two) times daily. May use with saran wrap at bedtime after shower 45 g 0  . BORIC ACID EX Place 1 capsule vaginally at bedtime as needed. For "maintenance"     . HYDROcodone-acetaminophen (NORCO/VICODIN) 5-325 MG tablet Take 1 tablet by mouth every 6 (six) hours as needed for severe pain. 10 tablet 0  . hyoscyamine (LEVSIN/SL) 0.125 MG SL tablet Place 1 tablet (0.125 mg total) under the tongue every 4 (four) hours as needed. 20 tablet 0  . ibuprofen (ADVIL,MOTRIN) 800 MG tablet Take 1 tablet (800 mg total) by mouth every 8 (eight) hours as needed. 21 tablet 0  . loperamide (IMODIUM A-D) 2 MG tablet Take 1 tablet (2 mg total) by mouth 4 (four) times daily as needed for diarrhea or loose stools. 20 tablet 0  . meloxicam (MOBIC) 15 MG tablet Take 1 tablet (15 mg total) by mouth daily. 30 tablet 0  . naproxen (NAPROSYN) 500 MG tablet Take 1 tablet (500 mg total) by mouth 2 (two) times daily. 30 tablet 0  . ondansetron (ZOFRAN ODT) 4 MG disintegrating tablet Take 1 tablet (4 mg total) by mouth every 8 (eight) hours as needed for nausea or vomiting. 20 tablet 0  . Terbinafine HCl (LAMISIL AT SPRAY) 1 % SOLN Spray to feet at night 125 mL 2  . urea (GORDONS UREA) 40 %  ointment Apply topically as needed. 30 g 0   Current Facility-Administered Medications on File Prior to Visit  Medication Dose Route Frequency Provider Last Rate Last Dose  . triamcinolone acetonide (KENALOG) 10 MG/ML injection 10 mg  10 mg Other Once Landis Martins, DPM       No Known Allergies  Objective:  General: Alert and oriented x3 in no acute distress  Dermatology:Continued healed biopsy site with Itchy dry patch of skin sub met 1 on right, No open lesions bilateral lower extremities, no webspace macerations, no ecchymosis bilateral, all nails x 10 are well manicured.  Neurovascular: Intact. No lower extremity edema bilateral.   Musculoskeletal:There is no pain with palpation sub met 2-3 on right with direct palpation and with extension of toes continues to be resolved after steroid shot, No pain with calf compression bilateral. Strength within normal limits in all groups bilateral.   Assessment and Plan: Problem List Items Addressed This Visit    None    Visit Diagnoses    Lichen simplex chronicus    -  Primary   Relevant Medications   diphenhydrAMINE (BENADRYL) 50 MG tablet   Right foot pain          -Complete examination performed -Discussed continued care for lichen simplex chronicus  -Biopsy site well healed  -Continue with Bethamethasone and urea 40 to use as instructed  -Rx  Benadryl to take at bedtime to see if this will help in addition to relieve the itching sensation and advised patient to try to avoid excessive stressors  -Recommend good supportive shoes  -Patient to return to call office if not improve or return sooner if condition worsens.  Landis Martins, DPM

## 2017-12-13 ENCOUNTER — Encounter (HOSPITAL_BASED_OUTPATIENT_CLINIC_OR_DEPARTMENT_OTHER): Payer: Self-pay | Admitting: Emergency Medicine

## 2017-12-13 ENCOUNTER — Emergency Department (HOSPITAL_BASED_OUTPATIENT_CLINIC_OR_DEPARTMENT_OTHER)
Admission: EM | Admit: 2017-12-13 | Discharge: 2017-12-13 | Disposition: A | Discharge status: Home / Self Care | Payer: 59 | Attending: Emergency Medicine | Admitting: Emergency Medicine

## 2017-12-13 ENCOUNTER — Other Ambulatory Visit: Payer: Self-pay

## 2017-12-13 DIAGNOSIS — R21 Rash and other nonspecific skin eruption: Secondary | ICD-10-CM | POA: Insufficient documentation

## 2017-12-13 MED ORDER — DIPHENHYDRAMINE HCL 25 MG PO CAPS
50.0000 mg | ORAL_CAPSULE | Freq: Once | ORAL | Status: AC
  Administered 2017-12-13: 50 mg via ORAL
  Filled 2017-12-13: qty 2

## 2017-12-13 MED ORDER — METHYLPREDNISOLONE SODIUM SUCC 125 MG IJ SOLR
125.0000 mg | Freq: Once | INTRAMUSCULAR | Status: AC
  Administered 2017-12-13: 125 mg via INTRAMUSCULAR
  Filled 2017-12-13: qty 2

## 2017-12-13 MED ORDER — METHYLPREDNISOLONE 4 MG PO TBPK: ORAL_TABLET | ORAL | 0 refills | Status: DC

## 2017-12-13 NOTE — ED Provider Notes (Signed)
[facility] Provider Note: Lowella DellJ. Lane Providencia Hottenstein, MD, FACEP  CSN: [id number] MRN: [id number] ARRIVAL: 12/13/17 at 0002 ROOM: [facility]   CHIEF COMPLAINT  Rash   HISTORY OF PRESENT ILLNESS  12/13/17 4:16 AM Stephanie Lee is a 42 y.o. female with a 2-day history of a rash to her upper back.  The rash consists of raised plaques.  It is pruritic and also tender to the touch.  She rates associated pain as an 8 out of 10 at its worst.  She has been applying cortisone cream without relief.  She has not been taking an antihistamine.  She is not aware of any changes or exposures that may have triggered this rash.  She denies location elsewhere.  She denies throat swelling, difficulty breathing, nausea, vomiting or diarrhea.   Past Medical History:  Diagnosis Date  . PID (pelvic inflammatory disease) 2012    Past Surgical History:  Procedure Laterality Date  . CESAREAN SECTION    . DILATION AND CURETTAGE OF UTERUS    . TONSILLECTOMY    . TUBAL LIGATION      Family History  Problem Relation Age of Onset  . Arthritis Mother   . Hypertension Mother   . Arthritis Father   . Cancer Father   . Hypertension Sister   . Stroke Sister   . Kidney disease Sister   . Other Neg Hx     Social History   Tobacco Use  . Smoking status: Never Smoker  . Smokeless tobacco: Never Used  Substance Use Topics  . Alcohol use: No  . Drug use: No    Prior to Admission medications   Medication Sig Start Date End Date Taking? Authorizing Provider  betamethasone dipropionate (DIPROLENE) 0.05 % cream Apply topically 2 (two) times daily. May use with saran wrap at bedtime after shower 06/28/17   Asencion IslamStover, Titorya, DPM  BORIC ACID EX Place 1 capsule vaginally at bedtime as needed. For "maintenance"     [provider]  diphenhydrAMINE (BENADRYL) 50 MG tablet Take 1 tablet (50 mg total) by mouth at bedtime as needed for itching. 08/02/17   Asencion IslamStover, Titorya, DPM  HYDROcodone-acetaminophen  (NORCO/VICODIN) 5-325 MG tablet Take 1 tablet by mouth every 6 (six) hours as needed for severe pain. 07/30/16   Ward, Chase PicketJaime Pilcher, PA-C  hyoscyamine (LEVSIN/SL) 0.125 MG SL tablet Place 1 tablet (0.125 mg total) under the tongue every 4 (four) hours as needed. 05/11/15   Donette LarryBhambri, Melanie, CNM  ibuprofen (ADVIL,MOTRIN) 800 MG tablet Take 1 tablet (800 mg total) by mouth every 8 (eight) hours as needed. 07/30/16   Ward, Chase PicketJaime Pilcher, PA-C  loperamide (IMODIUM A-D) 2 MG tablet Take 1 tablet (2 mg total) by mouth 4 (four) times daily as needed for diarrhea or loose stools. 05/11/15   Donette LarryBhambri, Melanie, CNM  meloxicam (MOBIC) 15 MG tablet Take 1 tablet (15 mg total) by mouth daily. 01/21/17   Dorena BodoKennard, Lawrence, NP  naproxen (NAPROSYN) 500 MG tablet Take 1 tablet (500 mg total) by mouth 2 (two) times daily. 10/11/15   Rancour, Jeannett SeniorStephen, MD  ondansetron (ZOFRAN ODT) 4 MG disintegrating tablet Take 1 tablet (4 mg total) by mouth every 8 (eight) hours as needed for nausea or vomiting. 05/11/15   Donette LarryBhambri, Melanie, CNM  Terbinafine HCl (LAMISIL AT SPRAY) 1 % SOLN Spray to feet at night 03/08/17   Stover, Titorya, DPM  urea (GORDONS UREA) 40 % ointment Apply topically as needed. 06/28/17   Asencion IslamStover, Titorya, DPM  Allergies Patient has no known allergies.   REVIEW OF SYSTEMS  Negative except as noted here or in the History of Present Illness.   PHYSICAL EXAMINATION  Initial Vital Signs Blood pressure 133/79, pulse 73, temperature 98.2 F (36.8 C), temperature source Oral, resp. rate 18, height 5\' 2"  (1.575 m), weight 97.1 kg (214 lb), last menstrual period 11/28/2017, SpO2 93 %.  Examination General: Well-developed, well-nourished female in no acute distress; appearance consistent with age of record HENT: normocephalic; atraumatic Eyes: Normal appearance Neck: supple Heart: regular rate and rhythm Lungs: clear to auscultation bilaterally Abdomen: soft; nondistended; nontender; bowel sounds  present Extremities: No deformity; full range of motion; pulses normal Neurologic: Awake, alert and oriented; motor function intact in all extremities and symmetric; no facial droop Skin: Warm and dry; rash of upper back:   Psychiatric: Normal mood and affect   RESULTS  Summary of this visit's results, reviewed by myself:   EKG Interpretation  Date/Time:    Ventricular Rate:    PR Interval:    QRS Duration:   QT Interval:    QTC Calculation:   R Axis:     Text Interpretation:        Laboratory Studies: No results found for this or any previous visit (from the past 24 hour(s)). Imaging Studies: No results found.  ED COURSE  Nursing notes and initial vitals signs, including pulse oximetry, reviewed.  Vitals:   12/13/17 0012 12/13/17 0014  BP:  133/79  Pulse:  73  Resp:  18  Temp:  98.2 F (36.8 C)  TempSrc:  Oral  SpO2:  93%  Weight: 97.1 kg (214 lb)   Height: 5\' 2"  (1.575 m)    4:29 AM We will treat with a steroid taper.  Patient also advised to take over-the-counter Benadryl or Zyrtec for itching.  She was advised she may need dermatologic follow-up if rash does not improve with treatment.  PROCEDURES    ED DIAGNOSES     ICD-10-CM   1. Rash R21        Mallie Linnemann, Jonny Ruiz, MD 12/13/17 [id number]

## 2017-12-13 NOTE — ED Notes (Signed)
Pt discharged to home with family. NAD.  

## 2017-12-13 NOTE — ED Triage Notes (Signed)
PT presents with c/o rash to back of neck and upper back since last night. PT states she has been applying cortisone cream without relief.

## 2017-12-13 NOTE — ED Notes (Signed)
Rash, redness and itching to upper back for a day that is unrelieved by OTC cream.  Pt denies new soap, denies new foods, denies new allergen exposure.

## 2018-04-18 ENCOUNTER — Ambulatory Visit: Payer: 59 | Admitting: Sports Medicine

## 2018-05-02 ENCOUNTER — Ambulatory Visit: Payer: 59 | Admitting: Sports Medicine

## 2018-09-26 ENCOUNTER — Other Ambulatory Visit: Payer: Self-pay | Admitting: Obstetrics and Gynecology

## 2018-09-26 DIAGNOSIS — Z1231 Encounter for screening mammogram for malignant neoplasm of breast: Secondary | ICD-10-CM

## 2018-11-01 ENCOUNTER — Ambulatory Visit
Admission: RE | Admit: 2018-11-01 | Discharge: 2018-11-01 | Disposition: A | Discharge status: Home / Self Care | Payer: 59 | Source: Ambulatory Visit | Attending: Obstetrics and Gynecology | Admitting: Obstetrics and Gynecology

## 2018-11-01 DIAGNOSIS — Z1231 Encounter for screening mammogram for malignant neoplasm of breast: Secondary | ICD-10-CM

## 2018-12-21 ENCOUNTER — Emergency Department (HOSPITAL_BASED_OUTPATIENT_CLINIC_OR_DEPARTMENT_OTHER)
Admission: EM | Admit: 2018-12-21 | Discharge: 2018-12-21 | Disposition: A | Discharge status: Home / Self Care | Payer: 59 | Attending: Emergency Medicine | Admitting: Emergency Medicine

## 2018-12-21 ENCOUNTER — Encounter (HOSPITAL_BASED_OUTPATIENT_CLINIC_OR_DEPARTMENT_OTHER): Payer: Self-pay | Admitting: *Deleted

## 2018-12-21 ENCOUNTER — Emergency Department (HOSPITAL_BASED_OUTPATIENT_CLINIC_OR_DEPARTMENT_OTHER): Payer: 59

## 2018-12-21 ENCOUNTER — Other Ambulatory Visit: Payer: Self-pay

## 2018-12-21 DIAGNOSIS — R0789 Other chest pain: Secondary | ICD-10-CM | POA: Insufficient documentation

## 2018-12-21 DIAGNOSIS — R079 Chest pain, unspecified: Secondary | ICD-10-CM | POA: Diagnosis present

## 2018-12-21 LAB — COMPREHENSIVE METABOLIC PANEL
ALBUMIN: 3.8 g/dL (ref 3.5–5.0)
ALT: 13 U/L (ref 0–44)
ANION GAP: 4 — AB (ref 5–15)
AST: 18 U/L (ref 15–41)
Alkaline Phosphatase: 49 U/L (ref 38–126)
BUN: 10 mg/dL (ref 6–20)
CHLORIDE: 109 mmol/L (ref 98–111)
CO2: 24 mmol/L (ref 22–32)
Calcium: 9.2 mg/dL (ref 8.9–10.3)
Creatinine, Ser: 0.76 mg/dL (ref 0.44–1.00)
GFR calc Af Amer: >60 mL/min (ref >60)
GLUCOSE: 116 mg/dL — AB (ref 70–99)
POTASSIUM: 3.8 mmol/L (ref 3.5–5.1)
Sodium: 137 mmol/L (ref 135–145)
Total Bilirubin: 0.4 mg/dL (ref 0.3–1.2)
Total Protein: 6.9 g/dL (ref 6.5–8.1)

## 2018-12-21 LAB — CBC WITH DIFFERENTIAL/PLATELET
ABS IMMATURE GRANULOCYTES: 0.02 10*3/uL (ref 0.00–0.07)
BASOS ABS: 0 10*3/uL (ref 0.0–0.1)
Basophils Relative: 0 %
EOS PCT: 5 %
Eosinophils Absolute: 0.4 10*3/uL (ref 0.0–0.5)
HCT: 38.7 % (ref 36.0–46.0)
HEMOGLOBIN: 12.2 g/dL (ref 12.0–15.0)
Immature Granulocytes: 0 %
LYMPHS ABS: 3.1 10*3/uL (ref 0.7–4.0)
Lymphocytes Relative: 34 %
MCH: 28.2 pg (ref 26.0–34.0)
MCHC: 31.5 g/dL (ref 30.0–36.0)
MCV: 89.4 fL (ref 80.0–100.0)
Monocytes Absolute: 0.7 10*3/uL (ref 0.1–1.0)
Monocytes Relative: 8 %
NEUTROS PCT: 53 %
NRBC: 0 % (ref 0.0–0.2)
Neutro Abs: 4.7 10*3/uL (ref 1.7–7.7)
Platelets: 373 10*3/uL (ref 150–400)
RBC: 4.33 MIL/uL (ref 3.87–5.11)
RDW: 13.2 % (ref 11.5–15.5)
WBC: 9 10*3/uL (ref 4.0–10.5)

## 2018-12-21 LAB — TROPONIN I: Troponin I: <0.03 ng/mL (ref <0.03)

## 2018-12-21 LAB — D-DIMER, QUANTITATIVE (NOT AT ARMC): D DIMER QUANT: 0.33 ug{FEU}/mL (ref 0.00–0.50)

## 2018-12-21 MED ORDER — SODIUM CHLORIDE 0.9 % IV BOLUS
500.0000 mL | Freq: Once | INTRAVENOUS | Status: AC
  Administered 2018-12-21: 500 mL via INTRAVENOUS

## 2018-12-21 MED ORDER — KETOROLAC TROMETHAMINE 15 MG/ML IJ SOLN
15.0000 mg | Freq: Once | INTRAMUSCULAR | Status: AC
  Administered 2018-12-21: 15 mg via INTRAVENOUS
  Filled 2018-12-21: qty 1

## 2018-12-21 NOTE — ED Notes (Signed)
Patient transported to X-ray 

## 2018-12-21 NOTE — Discharge Instructions (Addendum)
Use Tylenol or ibuprofen as needed for pain. Make sure you are staying well-hydrated water. You should consider establishing care with a primary care doctor.  You may call [phone] number in the paperwork to help you set up primary care. Return to the emergency room if you develop any new, worsening, concerning symptoms.

## 2018-12-21 NOTE — ED Triage Notes (Signed)
C/o mid sternal cp radiating to right under breast tingling in right hand off and on, ha, some sob,

## 2018-12-21 NOTE — ED Notes (Signed)
Pt on monitor 

## 2018-12-21 NOTE — ED Provider Notes (Signed)
[facility] EMERGENCY DEPARTMENT Provider Note   CSN: [id number] Arrival date & time: 12/21/18  1818     History   Chief Complaint Chief Complaint  Patient presents with  . Chest Pain    HPI Stephanie Lee is a 43 y.o. female presenting for evaluation of chest pain.    Patient states for the past 4 days, she has been having intermittent chest pain which begins in the center of her chest and radiates to the right.  Today, pain has become more constant. Previously, sxs were worse with movement, but today is unchanged with movement.  She reports that due to the pain, she feels like she cannot take a deep breath in.  Today, patient developed a headache, which feels typical of her migraine headaches.  She reports throbbing, but no photophobia, nausea, or vomiting.  Additionally, this morning patient developed a cough.  Earlier this week, patient had some tingling of her right hand, but this has resolved without intervention.  No further tingling.  Patient denies recent fevers, chills, sore throat, abdominal pain, urinary symptoms, normal bowel movements.  She denies recent travel, surgeries, immobilization, history of cancer, history of previous DVT/PE, or hormone use.  She denies family history of heart problems.  She denies tobacco use.  No history of diabetes or hypertension.  She has not taken anything for her symptoms including Tylenol or ibuprofen.    HPI  Past Medical History:  Diagnosis Date  . PID (pelvic inflammatory disease) 2012    Patient Active Problem List   Diagnosis Date Noted  . PID (acute pelvic inflammatory disease) 08/15/2011    Class: Acute  . Acute pelvic pain, female 08/14/2011    Past Surgical History:  Procedure Laterality Date  . CESAREAN SECTION    . DILATION AND CURETTAGE OF UTERUS    . TONSILLECTOMY    . TUBAL LIGATION       OB History    Gravida  5   Para  3   Term  3   Preterm  0   AB  2   Living  3     SAB  0   TAB  2     Ectopic  0   Multiple  0   Live Births               Home Medications    Prior to Admission medications   Medication Sig Start Date End Date Taking? Authorizing Provider  diphenhydrAMINE (BENADRYL) 50 MG tablet Take 1 tablet (50 mg total) by mouth at bedtime as needed for itching. 08/02/17   Asencion Islam, DPM  methylPREDNISolone (MEDROL DOSEPAK) 4 MG TBPK tablet Take tapering dose per package instruction starting Wednesday, December 14, 2017. 12/13/17   Molpus, John, MD  Terbinafine HCl (LAMISIL AT SPRAY) 1 % SOLN Spray to feet at night 03/08/17   Stover, Titorya, DPM  urea (GORDONS UREA) 40 % ointment Apply topically as needed. 06/28/17   Asencion Islam, DPM    Family History Family History  Problem Relation Age of Onset  . Arthritis Mother   . Hypertension Mother   . Arthritis Father   . Cancer Father   . Hypertension Sister   . Stroke Sister   . Kidney disease Sister   . Other Neg Hx   . Breast cancer Neg Hx     Social History Social History   Tobacco Use  . Smoking status: Never Smoker  . Smokeless tobacco: Never Used  Substance Use  Topics  . Alcohol use: No  . Drug use: No     Allergies   Patient has no known allergies.   Review of Systems Review of Systems  Respiratory: Positive for cough.   Cardiovascular: Positive for chest pain.  Neurological: Positive for headaches.  All other systems reviewed and are negative.    Physical Exam Updated Vital Signs BP 131/86   Pulse 74   Temp 98.6 F (37 C) (Oral)   Resp 18   Ht 5\' 1"  (1.549 m)   Wt 103.9 kg   LMP 12/21/2018 (Exact Date)   SpO2 99%   BMI 43.27 kg/m   Physical Exam Vitals signs and nursing note reviewed.  Constitutional:      General: She is not in acute distress.    Appearance: She is well-developed.     Comments: Obese female sitting comfortably in the bed in no acute distress  HENT:     Head: Normocephalic and atraumatic.  Eyes:     Conjunctiva/sclera: Conjunctivae normal.      Pupils: Pupils are equal, round, and reactive to light.  Neck:     Musculoskeletal: Normal range of motion and neck supple.  Cardiovascular:     Rate and Rhythm: Normal rate and regular rhythm.     Pulses: Normal pulses.  Pulmonary:     Effort: Pulmonary effort is normal. No respiratory distress.     Breath sounds: Normal breath sounds. No wheezing.     Comments: Speaking in full sentences.  Clear lung sounds in all fields.  No signs of respiratory distress. Tenderness to palpation of right-sided chest wall. Chest:     Chest wall: Tenderness present.  Abdominal:     General: There is no distension.     Palpations: Abdomen is soft. There is no mass.     Tenderness: There is no abdominal tenderness. There is no guarding or rebound.  Musculoskeletal: Normal range of motion.     Comments: No leg pain or swelling  Skin:    General: Skin is warm and dry.     Capillary Refill: Capillary refill takes less than 2 seconds.  Neurological:     General: No focal deficit present.     Mental Status: She is alert and oriented to person, place, and time.     Cranial Nerves: No cranial nerve deficit.     Sensory: No sensory deficit.     Motor: No weakness.     Coordination: Coordination normal.     Deep Tendon Reflexes: Reflexes normal.     Comments: No obvious neurologic deficits.  CN intact.  Finger intact.  Grip strength intact.  Fine movement and coronation intact.      ED Treatments / Results  Labs (all labs ordered are listed, but only abnormal results are displayed) Labs Reviewed  COMPREHENSIVE METABOLIC PANEL - Abnormal; Notable for the following components:      Result Value   Glucose, Bld 116 (*)    Anion gap 4 (*)    All other components within normal limits  CBC WITH DIFFERENTIAL/PLATELET  TROPONIN I  D-DIMER, QUANTITATIVE (NOT [facility])    EKG EKG Interpretation  Date/Time:  Thursday December 21 2018 18:27:47 EST Ventricular Rate:  74 PR Interval:    QRS  Duration: 69 QT Interval:  388 QTC Calculation: 431 R Axis:   58 Text Interpretation:  Sinus rhythm Low voltage, precordial leads Confirmed by Kennis CarinaBero, Michael [id number]) on 12/21/2018 6:36:30 PM   Radiology Dg Chest 2  View  Result Date: 12/21/2018 CLINICAL DATA:  43 year old female with chest pain. EXAM: CHEST - 2 VIEW COMPARISON:  None. FINDINGS: The heart size and mediastinal contours are within normal limits. Both lungs are clear. The visualized skeletal structures are unremarkable. IMPRESSION: No active cardiopulmonary disease. Electronically Signed   By: Elgie CollardArash  Radparvar M.D.   On: 12/21/2018 19:05    Procedures Procedures (including critical care time)  Medications Ordered in ED Medications  ketorolac (TORADOL) 15 MG/ML injection 15 mg (15 mg Intravenous Given 12/21/18 1915)  sodium chloride 0.9 % bolus 500 mL ( Intravenous Stopped 12/21/18 1952)     Initial Impression / Assessment and Plan / ED Course  I have reviewed the triage vital signs and the nursing notes.  Pertinent labs & imaging results that were available during my care of the patient were reviewed by me and considered in my medical decision making (see chart for details).     Pt presenting for evaluation of 4 days of chest pain, worsening today.  Physical exam reassuring, she is afebrile not tachycardic.  Appears nontoxic.  Pain is reproducible with a chest wall.  However, as pain became more constant and causing increased shortness of breath, consider possibility of PE despite no risk factors. Low suspicion for acs, labs drawn prior to my eval. headache consistent with previous migraines.  Neuro exam is reassuring, I do not believe she needs CT/MRI at this time.  EKG without STEMI.  Labs reassuring, troponin negative.  No leukocytosis.  Electrolytes stable.  Chest x-ray viewed interpreted by me, no pneumonia, no thorax, or effusion.  Dimer negative, as such, doubt PE.  Doubt ACS.  On reassessment, sxs resoveld with toradol  and fluids. Discussed findings with patient.  Discussed possibility of MSK pain versus viral illness versus anxiety.  Encourage follow-up with PCP for further evaluation.  At this time, patient appears safe for discharge.  Return precautions given.  Patient states she understands and agrees to plan.   Final Clinical Impressions(s) / ED Diagnoses   Final diagnoses:  Atypical chest pain    ED Discharge Orders    None       Alveria ApleyCaccavale, Ramonte Mena, PA-C 12/21/18 2332    Sabas SousBero, Michael M, MD 12/22/18 [phone]

## 2019-10-06 ENCOUNTER — Other Ambulatory Visit: Payer: Self-pay

## 2019-10-06 ENCOUNTER — Emergency Department (HOSPITAL_BASED_OUTPATIENT_CLINIC_OR_DEPARTMENT_OTHER)
Admission: EM | Admit: 2019-10-06 | Discharge: 2019-10-06 | Disposition: A | Discharge status: Home / Self Care | Payer: 59 | Attending: Emergency Medicine | Admitting: Emergency Medicine

## 2019-10-06 ENCOUNTER — Encounter (HOSPITAL_BASED_OUTPATIENT_CLINIC_OR_DEPARTMENT_OTHER): Payer: Self-pay | Admitting: Emergency Medicine

## 2019-10-06 DIAGNOSIS — N390 Urinary tract infection, site not specified: Secondary | ICD-10-CM | POA: Insufficient documentation

## 2019-10-06 LAB — URINALYSIS, ROUTINE W REFLEX MICROSCOPIC
Bilirubin Urine: NEGATIVE
Glucose, UA: NEGATIVE mg/dL
Ketones, ur: NEGATIVE mg/dL
Nitrite: NEGATIVE
Protein, ur: NEGATIVE mg/dL
Specific Gravity, Urine: 1.015 (ref 1.005–1.030)
pH: 8 (ref 5.0–8.0)

## 2019-10-06 LAB — URINALYSIS, MICROSCOPIC (REFLEX)

## 2019-10-06 LAB — PREGNANCY, URINE: Preg Test, Ur: NEGATIVE

## 2019-10-06 MED ORDER — NITROFURANTOIN MONOHYD MACRO 100 MG PO CAPS
100.0000 mg | ORAL_CAPSULE | Freq: Once | ORAL | Status: AC
  Administered 2019-10-06: 100 mg via ORAL
  Filled 2019-10-06: qty 1

## 2019-10-06 MED ORDER — NITROFURANTOIN MONOHYD MACRO 100 MG PO CAPS: 100.0000 mg | ORAL_CAPSULE | Freq: Two times a day (BID) | ORAL | 0 refills | Status: DC

## 2019-10-06 MED ORDER — PHENAZOPYRIDINE HCL 200 MG PO TABS: 200.0000 mg | ORAL_TABLET | Freq: Three times a day (TID) | ORAL | 0 refills | Status: DC

## 2019-10-06 MED ORDER — PHENAZOPYRIDINE HCL 100 MG PO TABS
200.0000 mg | ORAL_TABLET | Freq: Once | ORAL | Status: AC
  Administered 2019-10-06: 23:00:00 200 mg via ORAL
  Filled 2019-10-06: qty 2

## 2019-10-06 NOTE — ED Triage Notes (Signed)
Pt reports cramping, urinary frequency, "pressue" after urinating x1-2 days. Admits to ED stating she wants to get checked before "it gets worse."

## 2019-10-06 NOTE — ED Provider Notes (Signed)
[facility] EMERGENCY DEPARTMENT Provider Note   CSN: [id number] Arrival date & time: 10/06/19  2211     History   Chief Complaint Chief Complaint  Patient presents with  . Urinary Frequency    HPI Stephanie Lee is a 43 y.o. female.     The history is provided by the patient.  Urinary Frequency This is a new problem. The current episode started 2 days ago. The problem occurs constantly. The problem has not changed since onset.Nothing relieves the symptoms. She has tried nothing for the symptoms. The treatment provided no relief.  Patient with frequency, and dysuria.  No f/c/r.  No flank pain.    Past Medical History:  Diagnosis Date  . PID (pelvic inflammatory disease) 2012    Patient Active Problem List   Diagnosis Date Noted  . PID (acute pelvic inflammatory disease) 08/15/2011    Class: Acute  . Acute pelvic pain, female 08/14/2011    Past Surgical History:  Procedure Laterality Date  . CESAREAN SECTION    . DILATION AND CURETTAGE OF UTERUS    . TONSILLECTOMY    . TUBAL LIGATION       OB History    Gravida  5   Para  3   Term  3   Preterm  0   AB  2   Living  3     SAB  0   TAB  2   Ectopic  0   Multiple  0   Live Births               Home Medications    Prior to Admission medications   Medication Sig Start Date End Date Taking? Authorizing Provider  diphenhydrAMINE (BENADRYL) 50 MG tablet Take 1 tablet (50 mg total) by mouth at bedtime as needed for itching. 08/02/17   Landis Martins, DPM  methylPREDNISolone (MEDROL DOSEPAK) 4 MG TBPK tablet Take tapering dose per package instruction starting Wednesday, December 14, 2017. 12/13/17   Molpus, John, MD  Terbinafine HCl (LAMISIL AT SPRAY) 1 % SOLN Spray to feet at night 03/08/17   Stover, Titorya, DPM  urea (GORDONS UREA) 40 % ointment Apply topically as needed. 06/28/17   Landis Martins, DPM    Family History Family History  Problem Relation Age of Onset  . Arthritis  Mother   . Hypertension Mother   . Arthritis Father   . Cancer Father   . Hypertension Sister   . Stroke Sister   . Kidney disease Sister   . Other Neg Hx   . Breast cancer Neg Hx     Social History Social History   Tobacco Use  . Smoking status: Never Smoker  . Smokeless tobacco: Never Used  Substance Use Topics  . Alcohol use: No  . Drug use: No     Allergies   Patient has no known allergies.   Review of Systems Review of Systems  Constitutional: Negative for fever.  HENT: Negative for dental problem.   Eyes: Negative for visual disturbance.  Genitourinary: Positive for dysuria and frequency.  All other systems reviewed and are negative.    Physical Exam Updated Vital Signs BP (!) 151/107 (BP Location: Left Arm)   Pulse 86   Temp 99 F (37.2 C) (Oral)   Resp 18   Ht 5\' 1"  (1.549 m)   Wt 97.5 kg   LMP 09/22/2019 (Exact Date)   SpO2 96%   BMI 40.62 kg/m   Physical Exam Vitals signs and  nursing note reviewed.  Constitutional:      General: She is not in acute distress.    Appearance: Normal appearance.  HENT:     Head: Normocephalic and atraumatic.     Nose: Nose normal.  Eyes:     Pupils: Pupils are equal, round, and reactive to light.  Neck:     Musculoskeletal: Normal range of motion and neck supple.  Cardiovascular:     Rate and Rhythm: Normal rate and regular rhythm.     Pulses: Normal pulses.     Heart sounds: Normal heart sounds.  Pulmonary:     Effort: Pulmonary effort is normal.     Breath sounds: Normal breath sounds.  Abdominal:     General: Abdomen is flat. Bowel sounds are normal.     Tenderness: There is no abdominal tenderness. There is no guarding.  Musculoskeletal: Normal range of motion.  Skin:    General: Skin is warm and dry.     Capillary Refill: Capillary refill takes less than 2 seconds.  Neurological:     General: No focal deficit present.     Mental Status: She is alert and oriented to person, place, and time.      Deep Tendon Reflexes: Reflexes normal.  Psychiatric:        Mood and Affect: Mood normal.        Behavior: Behavior normal.      ED Treatments / Results  Labs (all labs ordered are listed, but only abnormal results are displayed) Results for orders placed or performed during the hospital encounter of 10/06/19  Urinalysis, Routine w reflex microscopic- may I&O cath if menses  Result Value Ref Range   Color, Urine YELLOW YELLOW   APPearance CLOUDY (A) CLEAR   Specific Gravity, Urine 1.015 1.005 - 1.030   pH 8.0 5.0 - 8.0   Glucose, UA NEGATIVE NEGATIVE mg/dL   Hgb urine dipstick TRACE (A) NEGATIVE   Bilirubin Urine NEGATIVE NEGATIVE   Ketones, ur NEGATIVE NEGATIVE mg/dL   Protein, ur NEGATIVE NEGATIVE mg/dL   Nitrite NEGATIVE NEGATIVE   Leukocytes,Ua SMALL (A) NEGATIVE  Pregnancy, urine  Result Value Ref Range   Preg Test, Ur NEGATIVE NEGATIVE  Urinalysis, Microscopic (reflex)  Result Value Ref Range   RBC / HPF 0-5 0 - 5 RBC/hpf   WBC, UA 21-50 0 - 5 WBC/hpf   Bacteria, UA MANY (A) NONE SEEN   Squamous Epithelial / LPF 0-5 0 - 5   Non Squamous Epithelial PRESENT (A) NONE SEEN   No results found. Radiology No results found.  Procedures Procedures (including critical care time)  Medications Ordered in ED Medications  nitrofurantoin (macrocrystal-monohydrate) (MACROBID) capsule 100 mg (has no administration in time range)  phenazopyridine (PYRIDIUM) tablet 200 mg (has no administration in time range)     Initial Impression / Assessment and Plan / ED Course   Patient with simple UTI, doing well.  Stable for outpatient therapy.  No signs of sepsis.    Stephanie Lee was evaluated in Emergency Department on 10/06/2019 for the symptoms described in the history of present illness. She was evaluated in the context of the global COVID-19 pandemic, which necessitated consideration that the patient might be at risk for infection with the SARS-CoV-2 virus that causes  COVID-19. Institutional protocols and algorithms that pertain to the evaluation of patients at risk for COVID-19 are in a state of rapid change based on information released by regulatory bodies including the CDC and federal and state organizations.  These policies and algorithms were followed during the patient's care in the ED. Final Clinical Impressions(s) / ED Diagnoses   Final diagnoses:  Lower urinary tract infectious disease    Return for weakness, numbness, changes in vision or speech, fevers >100.4 unrelieved by medication, shortness of breath, intractable vomiting, or diarrhea, abdominal pain, Inability to tolerate liquids or food, cough, altered mental status or any concerns. No signs of systemic illness or infection. The patient is nontoxic-appearing on exam and vital signs are within normal limits.   I have reviewed the triage vital signs and the nursing notes. Pertinent labs &imaging results that were available during my care of the patient were reviewed by me and considered in my medical decision making (see chart for details).  After history, exam, and medical workup I feel the patient has been appropriately medically screened and is safe for discharge home. Pertinent diagnoses were discussed with the patient. Patient was given return precautions   Robt Okuda, MD 10/06/19 2331

## 2019-11-25 IMAGING — CR DG CHEST 2V
2 series · 2 of 2 positions shown · non-contrast
Comparison: None.

CLINICAL DATA: 42-year-old female with chest pain.

EXAM:
CHEST - 2 VIEW

[w chest pa]
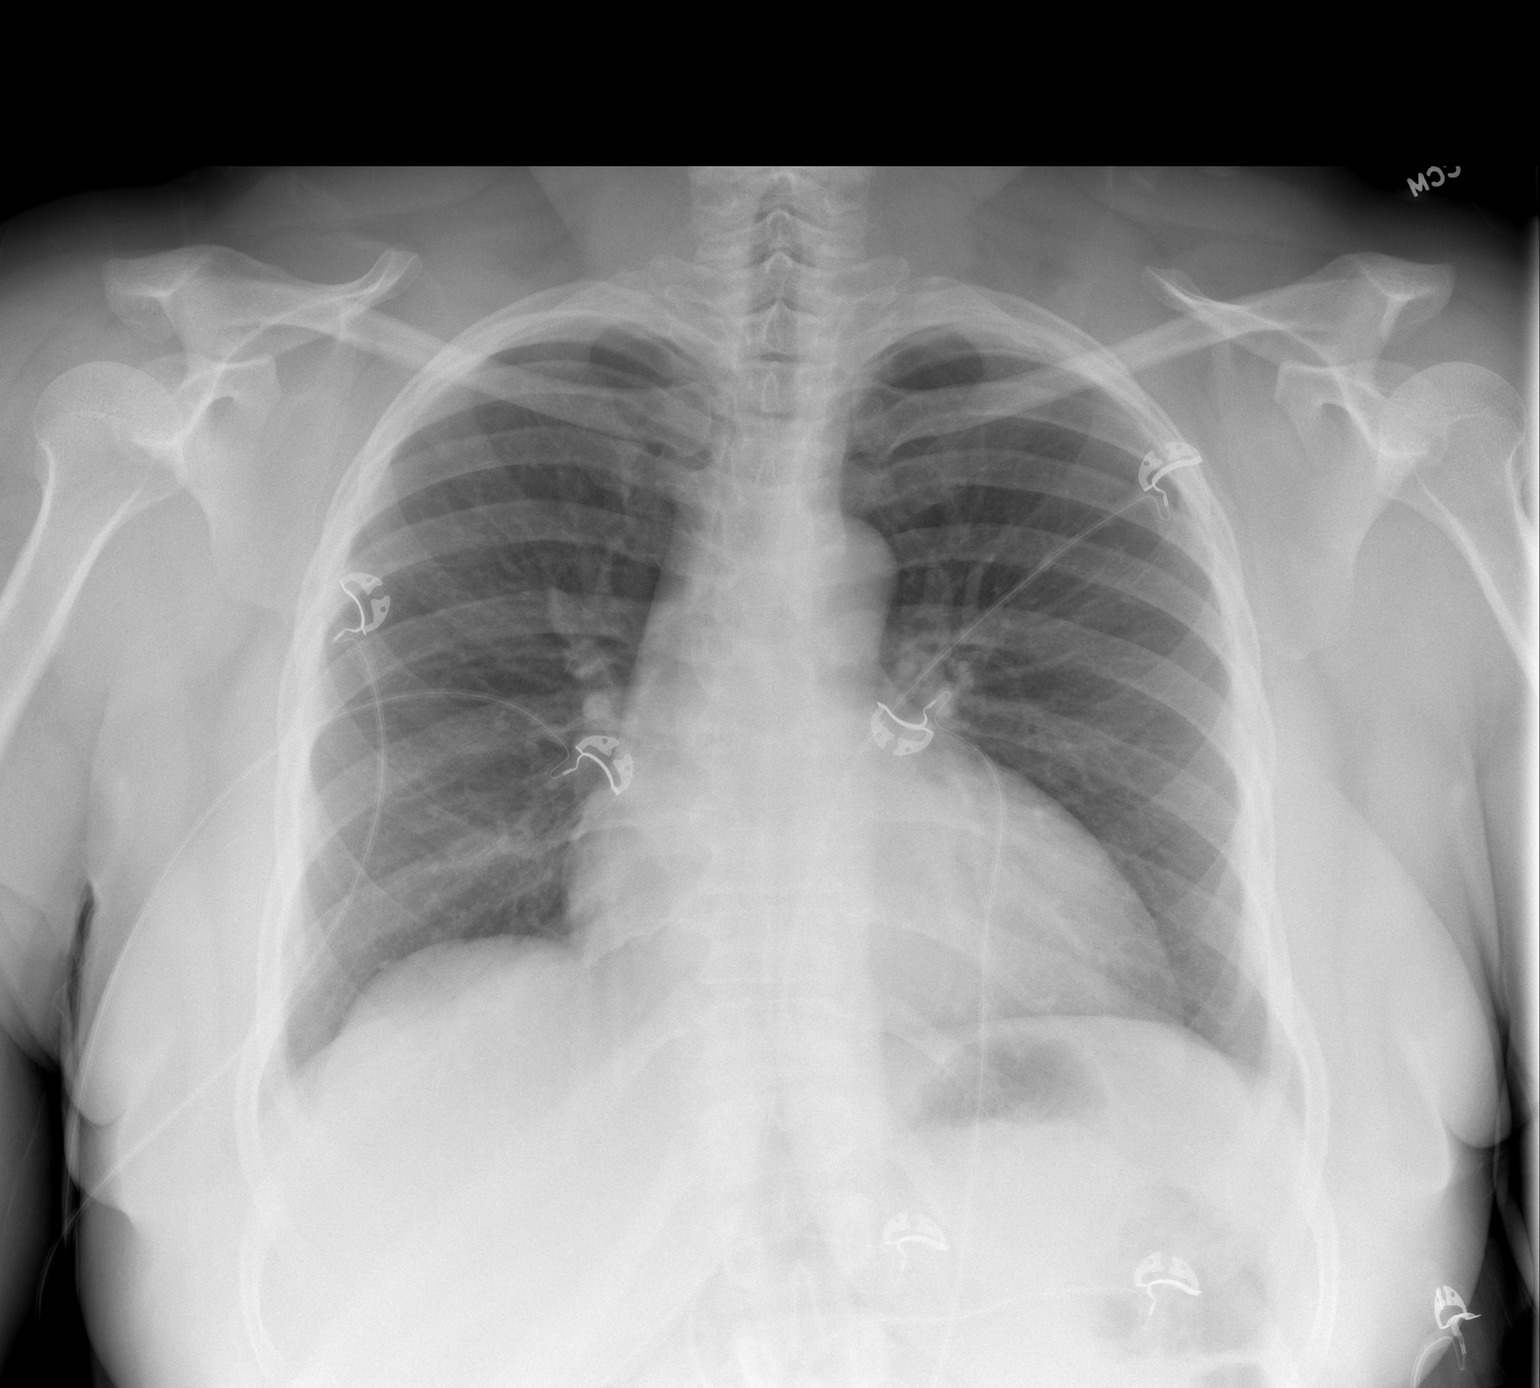

[w chest lat]
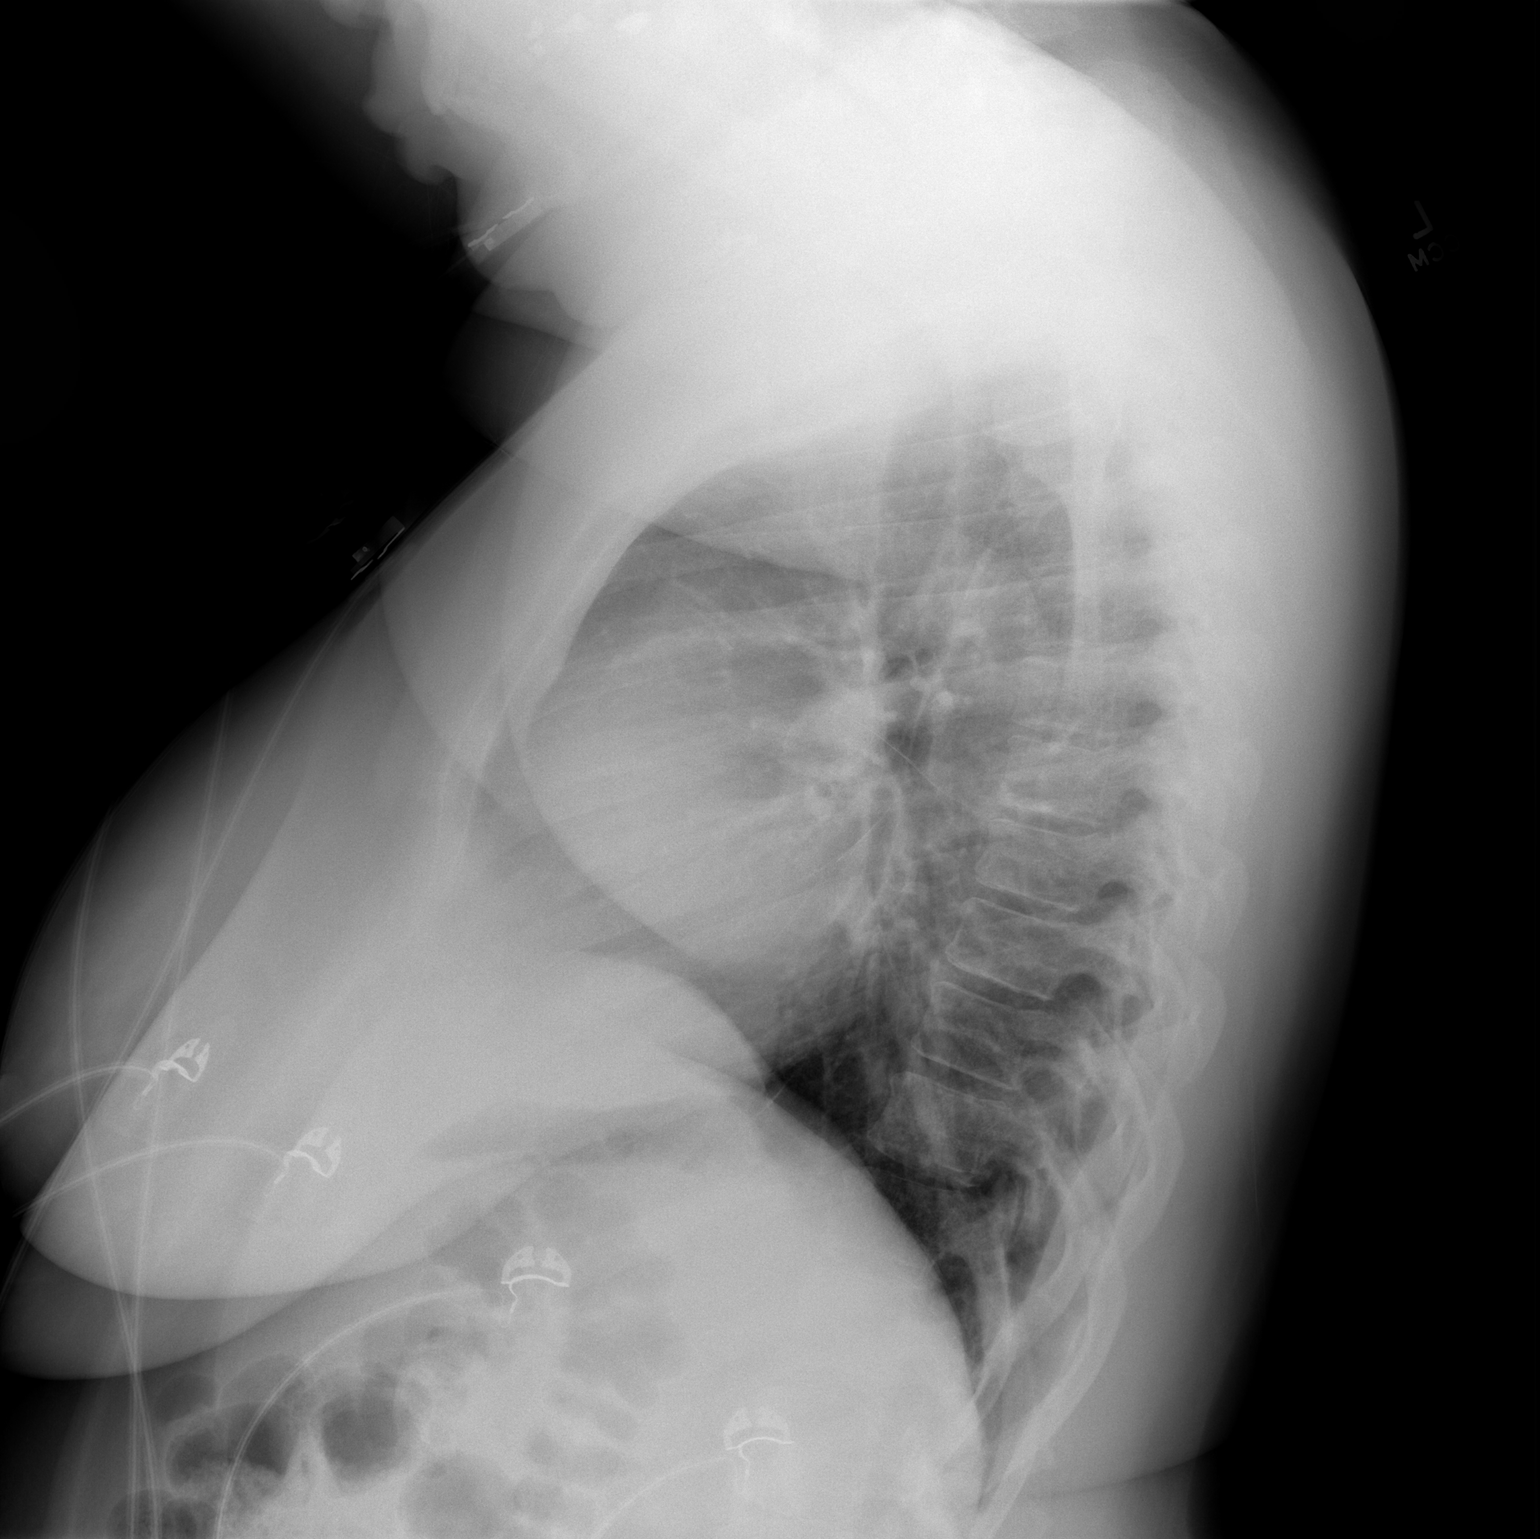

[2 of 2 positions shown; findings below may reference images not displayed]

FINDINGS: The heart size and mediastinal contours are within normal limits.
Both lungs are clear. The visualized skeletal structures are
unremarkable.
IMPRESSION: No active cardiopulmonary disease.

## 2020-07-07 ENCOUNTER — Emergency Department (HOSPITAL_BASED_OUTPATIENT_CLINIC_OR_DEPARTMENT_OTHER)
Admission: EM | Admit: 2020-07-07 | Discharge: 2020-07-07 | Disposition: A | Discharge status: Home / Self Care | Payer: 59 | Attending: Emergency Medicine | Admitting: Emergency Medicine

## 2020-07-07 ENCOUNTER — Encounter (HOSPITAL_BASED_OUTPATIENT_CLINIC_OR_DEPARTMENT_OTHER): Payer: Self-pay | Admitting: *Deleted

## 2020-07-07 ENCOUNTER — Other Ambulatory Visit: Payer: Self-pay

## 2020-07-07 DIAGNOSIS — Y999 Unspecified external cause status: Secondary | ICD-10-CM | POA: Insufficient documentation

## 2020-07-07 DIAGNOSIS — S80862A Insect bite (nonvenomous), left lower leg, initial encounter: Secondary | ICD-10-CM | POA: Diagnosis not present

## 2020-07-07 DIAGNOSIS — S70362A Insect bite (nonvenomous), left thigh, initial encounter: Secondary | ICD-10-CM | POA: Diagnosis present

## 2020-07-07 DIAGNOSIS — Y939 Activity, unspecified: Secondary | ICD-10-CM | POA: Insufficient documentation

## 2020-07-07 DIAGNOSIS — Y929 Unspecified place or not applicable: Secondary | ICD-10-CM | POA: Diagnosis not present

## 2020-07-07 DIAGNOSIS — W57XXXA Bitten or stung by nonvenomous insect and other nonvenomous arthropods, initial encounter: Secondary | ICD-10-CM | POA: Insufficient documentation

## 2020-07-07 DIAGNOSIS — S30860A Insect bite (nonvenomous) of lower back and pelvis, initial encounter: Secondary | ICD-10-CM | POA: Insufficient documentation

## 2020-07-07 MED ORDER — BACITRACIN ZINC 500 UNIT/GM EX OINT: 1.0000 "application " | TOPICAL_OINTMENT | Freq: Two times a day (BID) | CUTANEOUS | 0 refills | Status: DC

## 2020-07-07 MED ORDER — TRIAMCINOLONE ACETONIDE 0.1 % EX CREA: 1.0000 "application " | TOPICAL_CREAM | Freq: Two times a day (BID) | CUTANEOUS | 0 refills | Status: DC

## 2020-07-07 NOTE — ED Provider Notes (Signed)
[facility] EMERGENCY DEPARTMENT Provider Note   CSN: [id number] Arrival date & time: 07/07/20  1700     History Chief Complaint  Patient presents with  . Insect Bite    Stephanie Lee is a 44 y.o. female.  44 year old female, went out of town and now with bug bites. Unknown when this happened, notes bites are swollen and tender. Noticed yesterday, reports itching. Located on bottom and posterior left thigh and left calf.         Past Medical History:  Diagnosis Date  . PID (pelvic inflammatory disease) 2012    Patient Active Problem List   Diagnosis Date Noted  . PID (acute pelvic inflammatory disease) 08/15/2011    Class: Acute  . Acute pelvic pain, female 08/14/2011    Past Surgical History:  Procedure Laterality Date  . CESAREAN SECTION    . DILATION AND CURETTAGE OF UTERUS    . TONSILLECTOMY    . TUBAL LIGATION       OB History    Gravida  5   Para  3   Term  3   Preterm  0   AB  2   Living  3     SAB  0   TAB  2   Ectopic  0   Multiple  0   Live Births              Family History  Problem Relation Age of Onset  . Arthritis Mother   . Hypertension Mother   . Arthritis Father   . Cancer Father   . Hypertension Sister   . Stroke Sister   . Kidney disease Sister   . Other Neg Hx   . Breast cancer Neg Hx     Social History   Tobacco Use  . Smoking status: Never Smoker  . Smokeless tobacco: Never Used  Substance Use Topics  . Alcohol use: No  . Drug use: No    Home Medications Prior to Admission medications   Medication Sig Start Date End Date Taking? Authorizing Provider  bacitracin ointment Apply 1 application topically 2 (two) times daily. 07/07/20   Jeannie Fend, PA-C  diphenhydrAMINE (BENADRYL) 50 MG tablet Take 1 tablet (50 mg total) by mouth at bedtime as needed for itching. 08/02/17   Asencion Islam, DPM  methylPREDNISolone (MEDROL DOSEPAK) 4 MG TBPK tablet Take tapering dose per package instruction  starting Wednesday, December 14, 2017. 12/13/17   Molpus, John, MD  nitrofurantoin, macrocrystal-monohydrate, (MACROBID) 100 MG capsule Take 1 capsule (100 mg total) by mouth 2 (two) times daily. X 7 days 10/06/19   Palumbo, April, MD  phenazopyridine (PYRIDIUM) 200 MG tablet Take 1 tablet (200 mg total) by mouth 3 (three) times daily. 10/06/19   Palumbo, April, MD  Terbinafine HCl (LAMISIL AT SPRAY) 1 % SOLN Spray to feet at night 03/08/17   Asencion Islam, DPM  triamcinolone cream (KENALOG) 0.1 % Apply 1 application topically 2 (two) times daily. 07/07/20   Jeannie Fend, PA-C  urea (GORDONS UREA) 40 % ointment Apply topically as needed. 06/28/17   Asencion Islam, DPM    Allergies    Patient has no known allergies.  Review of Systems   Review of Systems  Constitutional: Negative for fever.  Musculoskeletal: Negative for arthralgias and myalgias.  Skin: Positive for rash.  Allergic/Immunologic: Negative for immunocompromised state.  Neurological: Negative for weakness and numbness.    Physical Exam Updated Vital Signs BP 131/83 (BP  Location: Right Arm)   Pulse 63   Temp 98.2 F (36.8 C) (Oral)   Resp 19   Ht 5\' 2"  (1.575 m)   Wt 99.8 kg   LMP 06/25/2020   SpO2 100%   BMI 40.24 kg/m   Physical Exam Vitals and nursing note reviewed.  Constitutional:      General: She is not in acute distress.    Appearance: She is well-developed. She is not diaphoretic.  HENT:     Head: Normocephalic and atraumatic.  Pulmonary:     Effort: Pulmonary effort is normal.  Skin:    General: Skin is warm and dry.     Findings: Erythema present.       Neurological:     Mental Status: She is alert and oriented to person, place, and time.  Psychiatric:        Behavior: Behavior normal.     ED Results / Procedures / Treatments   Labs (all labs ordered are listed, but only abnormal results are displayed) Labs Reviewed - No data to display  EKG None  Radiology No results  found.  Procedures Procedures (including critical care time)  Medications Ordered in ED Medications - No data to display  ED Course  I have reviewed the triage vital signs and the nursing notes.  Pertinent labs & imaging results that were available during my care of the patient were reviewed by me and considered in my medical decision making (see chart for details).  Clinical Course as of Jul 07 2029  Mon Jul 07, 2020  [id number] 44 year old female with concern for insect bites to right buttocks and left leg which she first noticed yesterday.  Area is itchy and painful and specifically the area to left buttocks is reported to be warm to the touch..  Examined as documented above, does appear consistent with insect bites.  At this point suspect local reaction and doubt infectious etiology.  Patient given prescription for triamcinolone topical steroid to apply as well as bacitracin to these areas, recommend Zyrtec daily and Benadryl at night if needed for her itching.  Advised she can also try warm Epson salt soaks.  Recommend follow-up with your PCP by Thursday if not improving.  Patient is requesting oral steroids, at this time I do not think systemic therapy is needed however advised to follow-up with PCP if symptoms progress.   [LM]    Clinical Course User Index [LM] Thursday   MDM Rules/Calculators/A&P                          Final Clinical Impression(s) / ED Diagnoses Final diagnoses:  Insect bite, unspecified site, initial encounter    Rx / DC Orders ED Discharge Orders         Ordered    triamcinolone cream (KENALOG) 0.1 %  2 times daily     Discontinue  Reprint     07/07/20 2025    bacitracin ointment  2 times daily     Discontinue  Reprint     07/07/20 2025           09/06/20, PA-C 07/07/20 2030    09/06/20, MD 07/14/20 1842

## 2020-07-07 NOTE — ED Triage Notes (Signed)
C/o mutiple insects bites bil buttocks x 2 days

## 2020-07-07 NOTE — Discharge Instructions (Addendum)
Apply triamcinolone and bacitracin topically to area. Take Zyrtec daily. Take Benadryl at night as needed. Warm Epsom salt soaks twice daily for 20 minutes. Recheck with your doctor Thursday if not improving.

## 2020-09-29 ENCOUNTER — Other Ambulatory Visit: Payer: 59

## 2020-09-29 DIAGNOSIS — Z20822 Contact with and (suspected) exposure to covid-19: Secondary | ICD-10-CM

## 2020-09-30 LAB — NOVEL CORONAVIRUS, NAA: SARS-CoV-2, NAA: NOT DETECTED

## 2020-09-30 LAB — SARS-COV-2, NAA 2 DAY TAT

## 2021-05-01 ENCOUNTER — Emergency Department (HOSPITAL_COMMUNITY): Payer: Self-pay

## 2021-05-01 ENCOUNTER — Encounter (HOSPITAL_COMMUNITY): Payer: Self-pay

## 2021-05-01 ENCOUNTER — Other Ambulatory Visit: Payer: Self-pay

## 2021-05-01 ENCOUNTER — Emergency Department (HOSPITAL_COMMUNITY)
Admission: EM | Admit: 2021-05-01 | Discharge: 2021-05-01 | Disposition: A | Discharge status: Home / Self Care | Payer: Self-pay | Attending: Emergency Medicine | Admitting: Emergency Medicine

## 2021-05-01 ENCOUNTER — Ambulatory Visit (HOSPITAL_COMMUNITY)
Admission: EM | Admit: 2021-05-01 | Discharge: 2021-05-01 | Disposition: A | Discharge status: Home / Self Care | Payer: Self-pay

## 2021-05-01 DIAGNOSIS — R1011 Right upper quadrant pain: Secondary | ICD-10-CM

## 2021-05-01 DIAGNOSIS — M62838 Other muscle spasm: Secondary | ICD-10-CM | POA: Insufficient documentation

## 2021-05-01 DIAGNOSIS — R079 Chest pain, unspecified: Secondary | ICD-10-CM | POA: Insufficient documentation

## 2021-05-01 LAB — COMPREHENSIVE METABOLIC PANEL
ALT: 13 U/L (ref 0–44)
AST: 19 U/L (ref 15–41)
Albumin: 3.9 g/dL (ref 3.5–5.0)
Alkaline Phosphatase: 47 U/L (ref 38–126)
Anion gap: 9 (ref 5–15)
BUN: 11 mg/dL (ref 6–20)
CO2: 26 mmol/L (ref 22–32)
Calcium: 9.2 mg/dL (ref 8.9–10.3)
Chloride: 105 mmol/L (ref 98–111)
Creatinine, Ser: 0.81 mg/dL (ref 0.44–1.00)
GFR, Estimated: >60 mL/min (ref >60)
Glucose, Bld: 112 mg/dL — ABNORMAL HIGH (ref 70–99)
Potassium: 3.9 mmol/L (ref 3.5–5.1)
Sodium: 140 mmol/L (ref 135–145)
Total Bilirubin: 0.8 mg/dL (ref 0.3–1.2)
Total Protein: 6.7 g/dL (ref 6.5–8.1)

## 2021-05-01 LAB — CBC
HCT: 38.7 % (ref 36.0–46.0)
Hemoglobin: 12.5 g/dL (ref 12.0–15.0)
MCH: 28.9 pg (ref 26.0–34.0)
MCHC: 32.3 g/dL (ref 30.0–36.0)
MCV: 89.6 fL (ref 80.0–100.0)
Platelets: 339 10*3/uL (ref 150–400)
RBC: 4.32 MIL/uL (ref 3.87–5.11)
RDW: 13 % (ref 11.5–15.5)
WBC: 9.3 10*3/uL (ref 4.0–10.5)
nRBC: 0 % (ref 0.0–0.2)

## 2021-05-01 LAB — URINALYSIS, ROUTINE W REFLEX MICROSCOPIC
Bilirubin Urine: NEGATIVE
Glucose, UA: NEGATIVE mg/dL
Hgb urine dipstick: NEGATIVE
Ketones, ur: 5 mg/dL — AB
Leukocytes,Ua: NEGATIVE
Nitrite: NEGATIVE
Protein, ur: NEGATIVE mg/dL
Specific Gravity, Urine: 1.014 (ref 1.005–1.030)
pH: 7 (ref 5.0–8.0)

## 2021-05-01 LAB — I-STAT BETA HCG BLOOD, ED (MC, WL, AP ONLY): I-stat hCG, quantitative: <5 m[IU]/mL (ref <5)

## 2021-05-01 LAB — TROPONIN I (HIGH SENSITIVITY): Troponin I (High Sensitivity): 4 ng/L (ref <18)

## 2021-05-01 LAB — LIPASE, BLOOD: Lipase: 33 U/L (ref 11–51)

## 2021-05-01 MED ORDER — KETOROLAC TROMETHAMINE 30 MG/ML IJ SOLN: 15.0000 mg | Freq: Once | INTRAMUSCULAR | Status: DC

## 2021-05-01 MED ORDER — ONDANSETRON 4 MG PO TBDP
4.0000 mg | ORAL_TABLET | Freq: Once | ORAL | Status: AC
  Administered 2021-05-01: 4 mg via ORAL
  Filled 2021-05-01: qty 1

## 2021-05-01 MED ORDER — METHOCARBAMOL 500 MG PO TABS: 500.0000 mg | ORAL_TABLET | Freq: Two times a day (BID) | ORAL | 0 refills | Status: DC

## 2021-05-01 MED ORDER — NAPROXEN 375 MG PO TABS: 375.0000 mg | ORAL_TABLET | Freq: Two times a day (BID) | ORAL | 0 refills | Status: DC

## 2021-05-01 MED ORDER — DIAZEPAM 5 MG PO TABS
5.0000 mg | ORAL_TABLET | Freq: Once | ORAL | Status: AC
  Administered 2021-05-01: 5 mg via ORAL
  Filled 2021-05-01: qty 1

## 2021-05-01 MED ORDER — KETOROLAC TROMETHAMINE 30 MG/ML IJ SOLN
60.0000 mg | Freq: Once | INTRAMUSCULAR | Status: AC
  Administered 2021-05-01: 60 mg via INTRAMUSCULAR
  Filled 2021-05-01: qty 2

## 2021-05-01 MED ORDER — OXYCODONE-ACETAMINOPHEN 5-325 MG PO TABS
1.0000 | ORAL_TABLET | Freq: Once | ORAL | Status: AC
  Administered 2021-05-01: 1 via ORAL
  Filled 2021-05-01 (×2): qty 1

## 2021-05-01 NOTE — ED Triage Notes (Signed)
Pt in with c/o right rib cage spasms that started last night. Pt states she used warm compress with no relief  Pt states that when the spasms happen it takes her breath away. Also states it hurts to stand up and walk

## 2021-05-01 NOTE — ED Triage Notes (Signed)
Pt reports right rib pain . Pt reports sob. Pt reports it hurts to move or walk. Pt is crying in triage. Pt reports taking tylenol with no relief.

## 2021-05-01 NOTE — ED Provider Notes (Signed)
[facility]    CSN: [id number] Arrival date & time: 05/01/21  9371      History   Chief Complaint Chief Complaint  Patient presents with  . rib cage pain    HPI Stephanie Lee is a 45 y.o. female.   Patient here for evaluation of right upper quadrant/rib pain that started last night.  Reports pain has been increasing in intensity and frequency.  Reports trying some OTC medications and heat with no relief.  Patient very tearful during evaluation.  Denies any trauma, injury, or other precipitating event.  Denies any specific alleviating or aggravating factors.  Denies any fevers, chest pain, shortness of breath, N/V/D, numbness, tingling, weakness, or headaches.    The history is provided by the patient.    Past Medical History:  Diagnosis Date  . PID (pelvic inflammatory disease) 2012    Patient Active Problem List   Diagnosis Date Noted  . PID (acute pelvic inflammatory disease) 08/15/2011    Class: Acute  . Acute pelvic pain, female 08/14/2011    Past Surgical History:  Procedure Laterality Date  . CESAREAN SECTION    . DILATION AND CURETTAGE OF UTERUS    . TONSILLECTOMY    . TUBAL LIGATION      OB History    Gravida  5   Para  3   Term  3   Preterm  0   AB  2   Living  3     SAB  0   IAB  2   Ectopic  0   Multiple  0   Live Births               Home Medications    Prior to Admission medications   Medication Sig Start Date End Date Taking? Authorizing Provider  bacitracin ointment Apply 1 application topically 2 (two) times daily. 07/07/20   Jeannie Fend, PA-C  diphenhydrAMINE (BENADRYL) 50 MG tablet Take 1 tablet (50 mg total) by mouth at bedtime as needed for itching. 08/02/17   Asencion Islam, DPM  methylPREDNISolone (MEDROL DOSEPAK) 4 MG TBPK tablet Take tapering dose per package instruction starting Wednesday, December 14, 2017. 12/13/17   Molpus, John, MD  nitrofurantoin, macrocrystal-monohydrate, (MACROBID) 100 MG  capsule Take 1 capsule (100 mg total) by mouth 2 (two) times daily. X 7 days 10/06/19   Palumbo, April, MD  phenazopyridine (PYRIDIUM) 200 MG tablet Take 1 tablet (200 mg total) by mouth 3 (three) times daily. 10/06/19   Palumbo, April, MD  Terbinafine HCl (LAMISIL AT SPRAY) 1 % SOLN Spray to feet at night 03/08/17   Asencion Islam, DPM  triamcinolone cream (KENALOG) 0.1 % Apply 1 application topically 2 (two) times daily. 07/07/20   Jeannie Fend, PA-C  urea (GORDONS UREA) 40 % ointment Apply topically as needed. 06/28/17   Asencion Islam, DPM    Family History Family History  Problem Relation Age of Onset  . Arthritis Mother   . Hypertension Mother   . Arthritis Father   . Cancer Father   . Hypertension Sister   . Stroke Sister   . Kidney disease Sister   . Other Neg Hx   . Breast cancer Neg Hx     Social History Social History   Tobacco Use  . Smoking status: Never Smoker  . Smokeless tobacco: Never Used  Substance Use Topics  . Alcohol use: No  . Drug use: No     Allergies   Patient has no  known allergies.   Review of Systems Review of Systems  Gastrointestinal: Positive for abdominal pain. Negative for diarrhea, nausea, rectal pain and vomiting.     Physical Exam Triage Vital Signs ED Triage Vitals  Enc Vitals Group     BP 05/01/21 0929 132/77     Pulse Rate 05/01/21 0929 73     Resp 05/01/21 0929 (!) 22     Temp --      Temp src --      SpO2 05/01/21 0929 98 %     Weight --      Height --      Head Circumference --      Peak Flow --      Pain Score 05/01/21 0928 10     Pain Loc --      Pain Edu? --      Excl. in GC? --    No data found.  Updated Vital Signs BP 132/77 (BP Location: Left Arm)   Pulse 73   Resp (!) 22   LMP 04/16/2021 (Exact Date)   SpO2 98%   Visual Acuity Right Eye Distance:   Left Eye Distance:   Bilateral Distance:    Right Eye Near:   Left Eye Near:    Bilateral Near:     Physical Exam Vitals and nursing note  reviewed.  Constitutional:      General: She is not in acute distress.    Appearance: Normal appearance. She is not ill-appearing, toxic-appearing or diaphoretic.  HENT:     Head: Normocephalic and atraumatic.  Eyes:     Conjunctiva/sclera: Conjunctivae normal.  Cardiovascular:     Rate and Rhythm: Normal rate.     Pulses: Normal pulses.     Heart sounds: Normal heart sounds.  Pulmonary:     Effort: Pulmonary effort is normal.     Breath sounds: Normal breath sounds.  Abdominal:     General: Abdomen is flat.     Tenderness: There is abdominal tenderness in the right upper quadrant. There is no right CVA tenderness, left CVA tenderness or rebound.  Musculoskeletal:        General: Normal range of motion.     Cervical back: Normal range of motion.  Skin:    General: Skin is warm and dry.  Neurological:     General: No focal deficit present.     Mental Status: She is alert and oriented to person, place, and time.  Psychiatric:        Mood and Affect: Mood normal.      UC Treatments / Results  Labs (all labs ordered are listed, but only abnormal results are displayed) Labs Reviewed - No data to display  EKG   Radiology No results found.  Procedures Procedures (including critical care time)  Medications Ordered in UC Medications - No data to display  Initial Impression / Assessment and Plan / UC Course  I have reviewed the triage vital signs and the nursing notes.  Pertinent labs & imaging results that were available during my care of the patient were reviewed by me and considered in my medical decision making (see chart for details).     Patient has significant pain in the RUQ which limited ability to perform assessment.  Concerning for possible cholecystitis.  Patient instructed to go to the ED for further evaluation.  Patient is agreeable and will be transferred via private vehicle.  Final Clinical Impressions(s) / UC Diagnoses   Final diagnoses:  Abdominal  pain, right upper quadrant     Discharge Instructions     Go to the emergency department for further evaluation of your pain.     ED Prescriptions    None     PDMP not reviewed this encounter.   Ivette Loyal, NP 05/01/21 1001

## 2021-05-01 NOTE — Discharge Instructions (Signed)
  All the results in the ER are normal, labs and imaging. We are not sure what is causing your symptoms. The workup in the ER is not complete, and is limited to screening for life threatening and emergent conditions only, so please see a primary care doctor for further evaluation.  Please return to the ER if your symptoms worsen; you have increased pain, fevers, chills, inability to keep any medications down, confusion. Otherwise see the outpatient doctor as requested.  

## 2021-05-01 NOTE — Discharge Instructions (Signed)
Go to the emergency department for further evaluation of your pain. 

## 2021-05-04 NOTE — ED Provider Notes (Signed)
[facility] EMERGENCY DEPARTMENT Provider Note   CSN: [id number] Arrival date & time: 05/01/21  1000     History No chief complaint on file.   Stephanie Lee is a 45 y.o. female.  HPI  HPI: A 45 year old patient with a history of obesity presents for evaluation of chest pain. Initial onset of pain was more than 6 hours ago. The patient's chest pain is described as heaviness/pressure/tightness and is not worse with exertion. The patient's chest pain is not middle- or left-sided, is not well-localized, is not sharp and does not radiate to the arms/jaw/neck. The patient does not complain of nausea and denies diaphoresis. The patient has no history of stroke, has no history of peripheral artery disease, has not smoked in the past 90 days, denies any history of treated diabetes, has no relevant family history of coronary artery disease (first degree relative at less than age 37), is not hypertensive and has no history of hypercholesterolemia.    R sided chest pain, below breast. Moved recently. Pleuritic component associated with the pain. Pt has no hx of PE, DVT and denies any exogenous hormone (testosterone / estrogen) use, long distance travels or surgery in the past 6 weeks, active cancer, recent immobilization.   Past Medical History:  Diagnosis Date  . PID (pelvic inflammatory disease) 2012    Patient Active Problem List   Diagnosis Date Noted  . PID (acute pelvic inflammatory disease) 08/15/2011    Class: Acute  . Acute pelvic pain, female 08/14/2011    Past Surgical History:  Procedure Laterality Date  . CESAREAN SECTION    . DILATION AND CURETTAGE OF UTERUS    . TONSILLECTOMY    . TUBAL LIGATION       OB History    Gravida  5   Para  3   Term  3   Preterm  0   AB  2   Living  3     SAB  0   IAB  2   Ectopic  0   Multiple  0   Live Births              Family History  Problem Relation Age of Onset  . Arthritis Mother   .  Hypertension Mother   . Arthritis Father   . Cancer Father   . Hypertension Sister   . Stroke Sister   . Kidney disease Sister   . Other Neg Hx   . Breast cancer Neg Hx     Social History   Tobacco Use  . Smoking status: Never Smoker  . Smokeless tobacco: Never Used  Substance Use Topics  . Alcohol use: No  . Drug use: No    Home Medications Prior to Admission medications   Medication Sig Start Date End Date Taking? Authorizing Provider  Acetaminophen 500 MG capsule Take 500-1,000 mg by mouth every 6 (six) hours as needed (for pain).   Yes [provider]  methocarbamol (ROBAXIN) 500 MG tablet Take 1 tablet (500 mg total) by mouth 2 (two) times daily. 05/01/21  Yes Derwood Kaplan, MD  naproxen (NAPROSYN) 375 MG tablet Take 1 tablet (375 mg total) by mouth 2 (two) times daily. 05/01/21  Yes Derwood Kaplan, MD  bacitracin ointment Apply 1 application topically 2 (two) times daily. Patient not taking: No sig reported 07/07/20   Jeannie Fend, PA-C  diphenhydrAMINE (BENADRYL) 50 MG tablet Take 1 tablet (50 mg total) by mouth at bedtime as needed for  itching. Patient not taking: No sig reported 08/02/17   Asencion Islam, DPM  methylPREDNISolone (MEDROL DOSEPAK) 4 MG TBPK tablet Take tapering dose per package instruction starting Wednesday, December 14, 2017. Patient not taking: Reported on 05/01/2021 12/13/17   Molpus, Jonny Ruiz, MD  nitrofurantoin, macrocrystal-monohydrate, (MACROBID) 100 MG capsule Take 1 capsule (100 mg total) by mouth 2 (two) times daily. X 7 days Patient not taking: No sig reported 10/06/19   Palumbo, April, MD  phenazopyridine (PYRIDIUM) 200 MG tablet Take 1 tablet (200 mg total) by mouth 3 (three) times daily. Patient not taking: No sig reported 10/06/19   Palumbo, April, MD  Terbinafine HCl (LAMISIL AT SPRAY) 1 % SOLN Spray to feet at night Patient not taking: No sig reported 03/08/17   Asencion Islam, DPM  triamcinolone cream (KENALOG) 0.1 % Apply 1  application topically 2 (two) times daily. Patient not taking: No sig reported 07/07/20   Army Melia A, PA-C  urea (GORDONS UREA) 40 % ointment Apply topically as needed. Patient not taking: No sig reported 06/28/17   Asencion Islam, DPM    Allergies    Patient has no known allergies.  Review of Systems   Review of Systems  Constitutional: Positive for activity change.  Cardiovascular: Positive for chest pain.  All other systems reviewed and are negative.   Physical Exam Updated Vital Signs BP 123/86   Pulse 61   Temp 98.4 F (36.9 C) (Oral)   Resp 17   Ht 5\' 1"  (1.549 m)   Wt 106.1 kg   LMP 04/16/2021 (Exact Date)   SpO2 98%   BMI 44.21 kg/m   Physical Exam Vitals and nursing note reviewed.  Constitutional:      Appearance: She is well-developed.  HENT:     Head: Normocephalic and atraumatic.  Cardiovascular:     Rate and Rhythm: Normal rate.  Pulmonary:     Effort: Pulmonary effort is normal.  Abdominal:     General: Bowel sounds are normal.  Musculoskeletal:     Cervical back: Normal range of motion and neck supple.     Comments: CP reproduced over the R lower thoracic region, no rash.  Skin:    General: Skin is warm and dry.  Neurological:     Mental Status: She is alert and oriented to person, place, and time.     ED Results / Procedures / Treatments   Labs (all labs ordered are listed, but only abnormal results are displayed) Labs Reviewed  COMPREHENSIVE METABOLIC PANEL - Abnormal; Notable for the following components:      Result Value   Glucose, Bld 112 (*)    All other components within normal limits  URINALYSIS, ROUTINE W REFLEX MICROSCOPIC - Abnormal; Notable for the following components:   Ketones, ur 5 (*)    All other components within normal limits  LIPASE, BLOOD  CBC  I-STAT BETA HCG BLOOD, ED ([facility], [facility], [facility] ONLY)  TROPONIN I (HIGH SENSITIVITY)    EKG EKG Interpretation  Date/Time:  Friday May 01 2021 10:10:13 EDT Ventricular  Rate:  64 PR Interval:  164 QRS Duration: 68 QT Interval:  390 QTC Calculation: 402 R Axis:   17 Text Interpretation: Normal sinus rhythm with sinus arrhythmia Possible Anterior infarct , age undetermined Abnormal ECG No acute changes No significant change since last tracing Confirmed by [id number] [phone]) on 05/01/2021 11:58:12 AM   Radiology No results found.  Procedures Procedures   Medications Ordered in ED Medications  oxyCODONE-acetaminophen (PERCOCET/ROXICET) 5-325  MG per tablet 1 tablet (1 tablet Oral Given 05/01/21 1030)  ondansetron (ZOFRAN-ODT) disintegrating tablet 4 mg (4 mg Oral Given 05/01/21 1203)  diazepam (VALIUM) tablet 5 mg (5 mg Oral Given 05/01/21 1411)  ketorolac (TORADOL) 30 MG/ML injection 60 mg (60 mg Intramuscular Given 05/01/21 1411)    ED Course  I have reviewed the triage vital signs and the nursing notes.  Pertinent labs & imaging results that were available during my care of the patient were reviewed by me and considered in my medical decision making (see chart for details).    MDM Rules/Calculators/A&P HEAR Score: 2                        45 year old woman comes in with chief complaint of chest pain.  Pain is right-sided and has been present now for days.  Pain is constant, with intermittent waxing and waning intensity.  Pain is worse with deep inspiration.  No cough, no history of PE, no risk factors for it.  Hear score is low.  Clinical suspicion for ACS is quite low based on the fact that the pain has been constant. Ultrasound right upper quadrant ordered, it is negative for any acute findings.  Had a long discussion with patient.  My suspicion is high that she most likely has musculoskeletal pain.  We discussed the aggressive option of ruling out PE with D-dimer, versus wait and watch approach and discharge from the ER at this time.  Patient prefers the conservative option of waiting and watching, taking NSAIDs and returning to the ER if her  symptoms get worse.  Strict ER return precautions discussed with the patient, she is comfortable with the plan.  Final Clinical Impression(s) / ED Diagnoses Final diagnoses:  Abdominal pain, RUQ  Muscle spasm    Rx / DC Orders ED Discharge Orders         Ordered    methocarbamol (ROBAXIN) 500 MG tablet  2 times daily        05/01/21 1526    naproxen (NAPROSYN) 375 MG tablet  2 times daily        05/01/21 1526           Derwood Kaplan, MD 05/04/21 1601

## 2021-08-17 DIAGNOSIS — L669 Cicatricial alopecia, unspecified: Secondary | ICD-10-CM | POA: Diagnosis not present

## 2021-08-17 DIAGNOSIS — L659 Nonscarring hair loss, unspecified: Secondary | ICD-10-CM | POA: Diagnosis not present

## 2021-08-17 DIAGNOSIS — L661 Lichen planopilaris: Secondary | ICD-10-CM | POA: Diagnosis not present

## 2021-11-02 DIAGNOSIS — L661 Lichen planopilaris: Secondary | ICD-10-CM | POA: Diagnosis not present

## 2021-11-02 DIAGNOSIS — L089 Local infection of the skin and subcutaneous tissue, unspecified: Secondary | ICD-10-CM | POA: Diagnosis not present

## 2021-11-02 DIAGNOSIS — L669 Cicatricial alopecia, unspecified: Secondary | ICD-10-CM | POA: Diagnosis not present

## 2021-12-11 ENCOUNTER — Other Ambulatory Visit: Payer: Self-pay

## 2021-12-11 ENCOUNTER — Encounter (HOSPITAL_BASED_OUTPATIENT_CLINIC_OR_DEPARTMENT_OTHER): Payer: Self-pay | Admitting: *Deleted

## 2021-12-11 ENCOUNTER — Emergency Department (HOSPITAL_BASED_OUTPATIENT_CLINIC_OR_DEPARTMENT_OTHER)
Admission: EM | Admit: 2021-12-11 | Discharge: 2021-12-11 | Disposition: A | Discharge status: Home / Self Care | Payer: BC Managed Care – PPO | Attending: Emergency Medicine | Admitting: Emergency Medicine

## 2021-12-11 ENCOUNTER — Other Ambulatory Visit (HOSPITAL_BASED_OUTPATIENT_CLINIC_OR_DEPARTMENT_OTHER): Payer: Self-pay

## 2021-12-11 DIAGNOSIS — R21 Rash and other nonspecific skin eruption: Secondary | ICD-10-CM | POA: Diagnosis not present

## 2021-12-11 DIAGNOSIS — L03113 Cellulitis of right upper limb: Secondary | ICD-10-CM | POA: Insufficient documentation

## 2021-12-11 MED ORDER — CEPHALEXIN 500 MG PO CAPS
500.0000 mg | ORAL_CAPSULE | Freq: Two times a day (BID) | ORAL | 0 refills | Status: AC
  Filled 2021-12-11: qty 14, 7d supply, fill #0

## 2021-12-11 MED ORDER — TRIAMCINOLONE ACETONIDE 0.1 % EX CREA
1.0000 "application " | TOPICAL_CREAM | Freq: Two times a day (BID) | CUTANEOUS | 0 refills | Status: AC
  Filled 2021-12-11: qty 30, 15d supply, fill #0

## 2021-12-11 NOTE — ED Provider Notes (Signed)
[facility] EMERGENCY DEPARTMENT Provider Note   CSN: [id number] Arrival date & time: 12/11/21  1321     History  Chief Complaint  Patient presents with   Insect Bite    Stephanie Lee is a 46 y.o. female with no significant past medical history who presents to the ED complaining of insect bites onset yesterday.  Patient reports she has possible spider bites to her right arm.  She has associated redness, pruritus, and increased warmth.  Has tried over-the-counter hydrocortisone cream.  Denies swelling, wound, fever, chills, nausea, vomiting. Per pt she has a history of similar symptoms that was treated from the ED.   The history is provided by the patient. No language interpreter was used.      Home Medications Prior to Admission medications   Medication Sig Start Date End Date Taking? Authorizing Provider  cephALEXin (KEFLEX) 500 MG capsule Take 1 capsule (500 mg total) by mouth 2 (two) times daily for 7 days. 12/11/21 12/18/21 Yes Elda Dunkerson A, PA-C  Acetaminophen 500 MG capsule Take 500-1,000 mg by mouth every 6 (six) hours as needed (for pain).    [provider]  bacitracin ointment Apply 1 application topically 2 (two) times daily. Patient not taking: No sig reported 07/07/20   Tacy Learn, PA-C  diphenhydrAMINE (BENADRYL) 50 MG tablet Take 1 tablet (50 mg total) by mouth at bedtime as needed for itching. Patient not taking: No sig reported 08/02/17   Landis Martins, DPM  methocarbamol (ROBAXIN) 500 MG tablet Take 1 tablet (500 mg total) by mouth 2 (two) times daily. 05/01/21   Varney Biles, MD  methylPREDNISolone (MEDROL DOSEPAK) 4 MG TBPK tablet Take tapering dose per package instruction starting Wednesday, December 14, 2017. Patient not taking: Reported on 05/01/2021 12/13/17   Molpus, Jenny Reichmann, MD  naproxen (NAPROSYN) 375 MG tablet Take 1 tablet (375 mg total) by mouth 2 (two) times daily. 05/01/21   Varney Biles, MD  nitrofurantoin,  macrocrystal-monohydrate, (MACROBID) 100 MG capsule Take 1 capsule (100 mg total) by mouth 2 (two) times daily. X 7 days Patient not taking: No sig reported 10/06/19   Palumbo, April, MD  phenazopyridine (PYRIDIUM) 200 MG tablet Take 1 tablet (200 mg total) by mouth 3 (three) times daily. Patient not taking: No sig reported 10/06/19   Palumbo, April, MD  Terbinafine HCl (LAMISIL AT SPRAY) 1 % SOLN Spray to feet at night Patient not taking: No sig reported 03/08/17   Landis Martins, DPM  triamcinolone cream (KENALOG) 0.1 % Apply 1 application topically 2 (two) times daily. 12/11/21   Zuriel Yeaman A, PA-C  urea (GORDONS UREA) 40 % ointment Apply topically as needed. Patient not taking: No sig reported 06/28/17   Landis Martins, DPM      Allergies    Patient has no known allergies.    Review of Systems   Review of Systems  Constitutional:  Negative for chills and fever.  Gastrointestinal:  Negative for nausea and vomiting.  Skin:  Positive for color change and rash. Negative for wound.  All other systems reviewed and are negative.  Physical Exam Updated Vital Signs BP (!) 160/107 (BP Location: Right Arm)    Pulse 82    Temp 98.6 F (37 C) (Oral)    Resp 20    Ht 5\' 1"  (1.549 m)    Wt 106.1 kg    LMP 11/25/2021    SpO2 95%    BMI 44.20 kg/m  Physical Exam Vitals and nursing note reviewed.  Constitutional:      General: She is not in acute distress.    Appearance: Normal appearance.  Eyes:     General: No scleral icterus.    Extraocular Movements: Extraocular movements intact.  Cardiovascular:     Rate and Rhythm: Normal rate.  Pulmonary:     Effort: Pulmonary effort is normal. No respiratory distress.  Abdominal:     Palpations: Abdomen is soft. There is no mass.     Tenderness: There is no abdominal tenderness.  Musculoskeletal:        General: Normal range of motion.     Cervical back: Neck supple.  Skin:    General: Skin is warm and dry.     Findings: Erythema and rash  present.     Comments: Patient with area of erythema noted to right forearm.  Multiple excoriations noted.  Patient with small erythema noted to the right upper forearm.  Patient also with 2 areas of erythema noted to left hip.  Increased warmth noted to right upper extremity.  No drainage, abscess, fluctuance, or induration noted.  See picture below.  Neurological:     Mental Status: She is alert.     Sensory: Sensation is intact.     Motor: Motor function is intact.  Psychiatric:        Behavior: Behavior normal.         ED Results / Procedures / Treatments   Labs (all labs ordered are listed, but only abnormal results are displayed) Labs Reviewed - No data to display  EKG None  Radiology No results found.  Procedures Procedures    Medications Ordered in ED Medications - No data to display  ED Course/ Medical Decision Making/ A&P                           Medical Decision Making  Patient with possible insect bites to right upper extremity onset 2 days.  She has associated pruritus and redness to the area. On exam, patient with area cellulitis and increased warmth.  Right forearm. No obvious deformity.  Full active range of motion of right elbow and right wrist. Sensation and pulses intact bilaterally to upper extremities. Differential diagnosis includes cellulitis, septic arthritis, insect bite.  No imaging indicated at this time. Patient afebrile with vital signs within normal limits, this is less likely septic arthritis. Patient presentation suspicious for cellulitis secondary to probable insect bite.  Additional history obtained: External records from outside source obtained and reviewed including ED visit in 2021 where patient had similar symptoms.  At that time patient was treated with triamcinolone cream and bacitracin ointment with resolution of her symptoms.  Dispostion: After consideration of the diagnostic results and the patients response to treatment, I feel  that the patent would benefit from discharge home with prescription for keflex and triamcinolone cream and instructed to complete the entire course. Strict return precautions provided to patient regarding fever, increasing/worsening swelling, color change, or gait issue.  Supportive care and return precautions discussed with patient.  Patient acknowledges and voices understanding.  Patient appears safe for discharge at this time.  Follow-up as indicated in discharge paperwork.   This chart was dictated using voice recognition software, Dragon. Despite the best efforts of this provider to proofread and correct errors, errors may still occur which can change documentation meaning.    Final Clinical Impression(s) / ED Diagnoses Final diagnoses:  Cellulitis of right upper extremity    Rx /  DC Orders ED Discharge Orders          Ordered    cephALEXin (KEFLEX) 500 MG capsule  2 times daily        12/11/21 1541    triamcinolone cream (KENALOG) 0.1 %  2 times daily        12/11/21 1541              Amil Moseman A, PA-C 12/11/21 1546    Lucrezia Starch, MD 12/14/21 [phone]

## 2021-12-11 NOTE — ED Triage Notes (Signed)
She woke yesterday with possible spider bites to her right arm. Blister like multiple lesions on her forearm.

## 2021-12-11 NOTE — Discharge Instructions (Addendum)
It was a pleasure taking care of you today!   You will be prescribed triamcinolone cream and Keflex take as prescribed.  You may take over-the-counter Benadryl as directed on the bottle for itching. It is important that you do not scratch at the area as much as possible.  You may follow-up with your primary care provider as needed.  Return to the ED if you are experiencing increasing/worsening redness, pain, fever, worsening symptoms.

## 2022-05-09 ENCOUNTER — Emergency Department (HOSPITAL_BASED_OUTPATIENT_CLINIC_OR_DEPARTMENT_OTHER)
Admission: EM | Admit: 2022-05-09 | Discharge: 2022-05-09 | Disposition: A | Discharge status: Home / Self Care | Payer: BC Managed Care – PPO | Attending: Emergency Medicine | Admitting: Emergency Medicine

## 2022-05-09 ENCOUNTER — Emergency Department (HOSPITAL_BASED_OUTPATIENT_CLINIC_OR_DEPARTMENT_OTHER): Payer: BC Managed Care – PPO

## 2022-05-09 ENCOUNTER — Other Ambulatory Visit: Payer: Self-pay

## 2022-05-09 ENCOUNTER — Encounter (HOSPITAL_BASED_OUTPATIENT_CLINIC_OR_DEPARTMENT_OTHER): Payer: Self-pay | Admitting: Emergency Medicine

## 2022-05-09 DIAGNOSIS — M79631 Pain in right forearm: Secondary | ICD-10-CM | POA: Diagnosis not present

## 2022-05-09 DIAGNOSIS — M7989 Other specified soft tissue disorders: Secondary | ICD-10-CM | POA: Diagnosis not present

## 2022-05-09 DIAGNOSIS — M25531 Pain in right wrist: Secondary | ICD-10-CM | POA: Diagnosis not present

## 2022-05-09 DIAGNOSIS — M25572 Pain in left ankle and joints of left foot: Secondary | ICD-10-CM | POA: Insufficient documentation

## 2022-05-09 DIAGNOSIS — S59911A Unspecified injury of right forearm, initial encounter: Secondary | ICD-10-CM | POA: Diagnosis not present

## 2022-05-09 DIAGNOSIS — S6991XA Unspecified injury of right wrist, hand and finger(s), initial encounter: Secondary | ICD-10-CM | POA: Diagnosis not present

## 2022-05-09 DIAGNOSIS — M25562 Pain in left knee: Secondary | ICD-10-CM | POA: Diagnosis not present

## 2022-05-09 DIAGNOSIS — W19XXXA Unspecified fall, initial encounter: Secondary | ICD-10-CM

## 2022-05-09 DIAGNOSIS — S99912A Unspecified injury of left ankle, initial encounter: Secondary | ICD-10-CM | POA: Diagnosis not present

## 2022-05-09 MED ORDER — NAPROXEN 500 MG PO TABS: 500.0000 mg | ORAL_TABLET | Freq: Two times a day (BID) | ORAL | 0 refills | Status: DC

## 2022-05-09 MED ORDER — ACETAMINOPHEN 325 MG PO TABS
650.0000 mg | ORAL_TABLET | Freq: Once | ORAL | Status: AC
  Administered 2022-05-09: 650 mg via ORAL
  Filled 2022-05-09: qty 2

## 2022-05-09 MED ORDER — METHOCARBAMOL 500 MG PO TABS: 500.0000 mg | ORAL_TABLET | Freq: Two times a day (BID) | ORAL | 0 refills | Status: DC

## 2022-05-09 NOTE — ED Provider Notes (Signed)
[facility] EMERGENCY DEPARTMENT Provider Note   CSN: [id number] Arrival date & time: 05/09/22  1310    History  Chief Complaint  Patient presents with   Stephanie Lee    Tomasita Beevers is a 46 y.o. female with no significant past medical history here for evaluation after fall. Occurred yesterday. Fell down 2-3 steps. Denies hitting head, LOC, anticoagulation. Able to ambulate however pain with weight baring to left ankle. Some overlying soft tissue swelling.  Also has some pain diffusely to right wrist extending into her forearm.  Pain worse with range of motion.  Nontender at scaphoid.  Nontender hand, olecranon, forearm.  No midline back, neck pain.  No headache.  No numbness or weakness.  No contusions, abrasions.  No breaks in skin, laceration  HPI     Home Medications Prior to Admission medications   Medication Sig Start Date End Date Taking? Authorizing Provider  methocarbamol (ROBAXIN) 500 MG tablet Take 1 tablet (500 mg total) by mouth 2 (two) times daily. 05/09/22  Yes Davied Nocito A, PA-C  naproxen (NAPROSYN) 500 MG tablet Take 1 tablet (500 mg total) by mouth 2 (two) times daily. 05/09/22  Yes Stephan Draughn A, PA-C  Acetaminophen 500 MG capsule Take 500-1,000 mg by mouth every 6 (six) hours as needed (for pain).    [provider]  triamcinolone cream (KENALOG) 0.1 % Apply 1 application on to the skin 2 (two) times daily. 12/11/21   Blue, Soijett A, PA-C      Allergies    Patient has no known allergies.    Review of Systems   Review of Systems  Constitutional: Negative.   HENT: Negative.    Respiratory: Negative.    Cardiovascular: Negative.   Gastrointestinal: Negative.   Genitourinary: Negative.   Musculoskeletal:  Negative for back pain, gait problem, neck pain and neck stiffness.       Left ankle, right wrist, forearm pain  Skin: Negative.   Neurological: Negative.   All other systems reviewed and are negative.  Physical Exam Updated Vital  Signs BP (!) 138/91 (BP Location: Right Arm)   Pulse 86   Temp 98.4 F (36.9 C) (Oral)   Resp 18   Ht  (1.549 m)   Wt 96.2 kg   LMP 04/12/2022   SpO2 96%   BMI 40.06 kg/m  Physical Exam Vitals and nursing note reviewed.  Constitutional:      General: She is not in acute distress.    Appearance: She is well-developed. She is not ill-appearing, toxic-appearing or diaphoretic.  HENT:     Head: Normocephalic and atraumatic.  Eyes:     Pupils: Pupils are equal, round, and reactive to light.  Cardiovascular:     Rate and Rhythm: Normal rate.     Pulses: Normal pulses.          Radial pulses are 2+ on the right side and 2+ on the left side.       Dorsalis pedis pulses are 2+ on the right side and 2+ on the left side.  Pulmonary:     Effort: No respiratory distress.  Abdominal:     General: There is no distension.  Musculoskeletal:        General: Normal range of motion.     Cervical back: Normal range of motion.     Comments: Midline C/T/L tenderness.  Nontender left upper extremity, right lower extremity.  Pelvis stable, nontender palpation.  No shortening or rotation of legs.  Nontender  right humerus.  Tenderness midshaft, distal right forearm, wrist.  Nontender hand.  Nontender scaphoid.  Able to flex, extend, pronate, supinate without difficulty at bilateral wrists.  Tenderness mild soft tissue swelling left lateral malleolus.  Nontender foot, midshaft, proximal tib-fib, knee, forearm.  Able to plantarflex, dorsiflex however pain with inversion left ankle no obvious effusion.  Skin:    General: Skin is warm and dry.     Capillary Refill: Capillary refill takes less than 2 seconds.     Comments: Soft tissue swelling left ankle, no erythema, warmth, effusion.  No lacerations, abrasion  Neurological:     General: No focal deficit present.     Mental Status: She is alert.     Cranial Nerves: Cranial nerves 2-12 are intact.     Sensory: Sensation is intact.     Motor: Motor  function is intact.     Comments: Intact sensation  Psychiatric:        Mood and Affect: Mood normal.    ED Results / Procedures / Treatments   Labs (all labs ordered are listed, but only abnormal results are displayed) Labs Reviewed - No data to display  EKG None  Radiology DG Forearm Right  Result Date: 05/09/2022 CLINICAL DATA:  Fall down stairs.  Right forearm injury and pain. EXAM: RIGHT FOREARM - 2 VIEW COMPARISON:  None Available. FINDINGS: There is no evidence of fracture or other focal bone lesions. Soft tissues are unremarkable. IMPRESSION: Negative. Electronically Signed   By: Danae OrleansJohn A Stahl M.D.   On: 05/09/2022 16:05   DG Wrist Complete Right  Result Date: 05/09/2022 CLINICAL DATA:  Trauma, fall EXAM: RIGHT WRIST - COMPLETE 3+ VIEW COMPARISON:  None Available. FINDINGS: No fracture or dislocation is seen. There are no opaque foreign bodies. IMPRESSION: No fracture or dislocation is seen in the right wrist. Electronically Signed   By: Ernie AvenaPalani  Rathinasamy M.D.   On: 05/09/2022 14:08   DG Ankle Complete Left  Result Date: 05/09/2022 CLINICAL DATA:  Patient fell down stairs.  Injury.  Pain. EXAM: LEFT ANKLE COMPLETE - 3+ VIEW COMPARISON:  None Available. FINDINGS: Diffuse soft tissue swelling. No fractures are identified. The ankle mortise is symmetric. IMPRESSION: Soft tissue swelling.  No fracture or dislocation identified. Electronically Signed   By: Gerome Samavid  Williams III M.D.   On: 05/09/2022 14:14    Procedures .Splint Application  Date/Time: 05/09/2022 4:02 PM Performed by: Linwood DibblesHenderly, Keyunna Coco A, PA-C Authorized by: Linwood DibblesHenderly, Ranita Stjulien A, PA-C   Consent:    Consent obtained:  Verbal   Consent given by:  Patient   Risks, benefits, and alternatives were discussed: yes     Risks discussed:  Discoloration, numbness, pain and swelling   Alternatives discussed:  Referral, observation, alternative treatment, delayed treatment and no treatment Universal protocol:    Procedure  explained and questions answered to patient or proxy's satisfaction: yes     Relevant documents present and verified: yes     Test results available: yes     Imaging studies available: yes     Required blood products, implants, devices, and special equipment available: yes     Site/side marked: yes     Immediately prior to procedure a time out was called: yes     Patient identity confirmed:  Verbally with patient Pre-procedure details:    Distal neurologic exam:  Normal   Distal perfusion: distal pulses strong and brisk capillary refill   Procedure details:    Location:  Ankle   Ankle location:  L ankle   Strapping: no     Cast type:  Short leg   Splint type:  Ankle stirrup   Supplies:  Prefabricated splint   Attestation: Splint applied and adjusted personally by me   Post-procedure details:    Distal neurologic exam:  Normal   Distal perfusion: distal pulses strong     Procedure completion:  Tolerated well, no immediate complications   Post-procedure imaging: not applicable      Medications Ordered in ED Medications  acetaminophen (TYLENOL) tablet 650 mg (650 mg Oral Given 05/09/22 1616)    ED Course/ Medical Decision Making/ A&P    46 year old no chronic medical problems here for evaluation after mechanical fall which occurred yesterday.  She has no obvious head injury, denies hitting head, LOC or anticoagulation.  No midline C/T/L tenderness.  She is neurovascularly intact.  Ambulatory however pain with weightbearing exercises to the left ankle.  She has no skin breakdown, lacerations that need closure.  No bony tenderness to foot, midshaft, proximal tib-fib.  Does have some overlying soft tissue swelling about left lateral malleolus however no erythema, warmth, low suspicion for septic joint.  Has some tenderness to midshaft, distal right forearm however nontender scaphoid, low suspicion for scaphoid fracture.  Neurovascularly intact, full range of motion.  Plan on x-rays.   Offered pain medicine, patient driving home.  Will give Tylenol here.  Imaging personally viewed and interpreted:  X-ray right wrist without fracture, dislocation X-ray left ankle with some soft tissue swelling without fracture, dislocation or effusion X-ray right forearm without acute abnormality  Discussed results with patient.  Placed patient in Aircast, crutches, provided pain management.  We will have her follow-up with orthopedics.  Patient agreeable.  The patient has been appropriately medically screened and/or stabilized in the ED. I have low suspicion for any other emergent medical condition which would require further screening, evaluation or treatment in the ED or require inpatient management.  Patient is hemodynamically stable and in no acute distress.  Patient able to ambulate in department prior to ED.  Evaluation does not show acute pathology that would require ongoing or additional emergent interventions while in the emergency department or further inpatient treatment.  I have discussed the diagnosis with the patient and answered all questions.  Pain is been managed while in the emergency department and patient has no further complaints prior to discharge.  Patient is comfortable with plan discussed in room and is stable for discharge at this time.  I have discussed strict return precautions for returning to the emergency department.  Patient was encouraged to follow-up with PCP/specialist refer to at discharge.                            Medical Decision Making Amount and/or Complexity of Data Reviewed External Data Reviewed: labs, radiology and notes. Radiology: ordered and independent interpretation performed. Decision-making details documented in ED Course.  Risk OTC drugs. Prescription drug management. Parenteral controlled substances. Diagnosis or treatment significantly limited by social determinants of health.          Final Clinical Impression(s) / ED  Diagnoses Final diagnoses:  Fall, initial encounter  Acute left ankle pain    Rx / DC Orders ED Discharge Orders          Ordered    naproxen (NAPROSYN) 500 MG tablet  2 times daily        05/09/22 1630    methocarbamol (ROBAXIN) 500  MG tablet  2 times daily        05/09/22 1630              Gwenn Teodoro A, PA-C 05/09/22 1653    Franne Forts, DO 05/09/22 1827

## 2022-05-09 NOTE — ED Notes (Signed)
Patient states  that she fell down three steps last night. Patients ankle is swollen. She has limited movement in her toes.

## 2022-05-09 NOTE — ED Triage Notes (Signed)
Pt c/o fell down 3 steps last night causing injury to right wrist and left ankle.

## 2022-05-09 NOTE — Discharge Instructions (Addendum)
Your x-rays today did not show any evidence of broken bones.  I placed you in an Aircast as you likely sprained or strained your left ankle.  Keep this on and use the crutches.  Make sure to rest, keep your leg elevated.  Follow-up with orthopedics.  Return for any new or worsening symptoms.

## 2022-05-20 ENCOUNTER — Ambulatory Visit (INDEPENDENT_AMBULATORY_CARE_PROVIDER_SITE_OTHER): Payer: BC Managed Care – PPO | Admitting: Family Medicine

## 2022-05-20 ENCOUNTER — Ambulatory Visit: Payer: Self-pay

## 2022-05-20 ENCOUNTER — Encounter: Payer: Self-pay | Admitting: Family Medicine

## 2022-05-20 ENCOUNTER — Other Ambulatory Visit (HOSPITAL_BASED_OUTPATIENT_CLINIC_OR_DEPARTMENT_OTHER): Payer: Self-pay

## 2022-05-20 VITALS — BP 130/80 | Ht 61.0 in | Wt 212.0 lb

## 2022-05-20 DIAGNOSIS — S63501A Unspecified sprain of right wrist, initial encounter: Secondary | ICD-10-CM | POA: Diagnosis not present

## 2022-05-20 DIAGNOSIS — S92215A Nondisplaced fracture of cuboid bone of left foot, initial encounter for closed fracture: Secondary | ICD-10-CM | POA: Diagnosis not present

## 2022-05-20 DIAGNOSIS — S63501D Unspecified sprain of right wrist, subsequent encounter: Secondary | ICD-10-CM | POA: Insufficient documentation

## 2022-05-20 DIAGNOSIS — M25531 Pain in right wrist: Secondary | ICD-10-CM

## 2022-05-20 MED ORDER — MELOXICAM 15 MG PO TABS
ORAL_TABLET | ORAL | 3 refills | Status: DC
  Filled 2022-05-20: qty 30, 30d supply, fill #0

## 2022-05-20 NOTE — Patient Instructions (Signed)
Nice to meet you Please try ice on the wrist  Please try the exercises  Please try the boot   Please send me a message in MyChart with any questions or updates.  Please see me back in 2-3 weeks.   --Dr. Jordan Likes

## 2022-05-20 NOTE — Assessment & Plan Note (Signed)
Acutely occurring.  Symptoms seem most consistent with a sprain at this time. -Counseled on home exercise therapy and supportive care. -Mobic. -Could consider physical therapy

## 2022-05-20 NOTE — Assessment & Plan Note (Signed)
Acutely occurring.  Symptoms seem most consistent with a cuboid fracture contusion given the pain in that area.  Does have an associated ankle sprain. -Counseled on home exercise therapy and supportive care. -Cam walker. -Could consider further imaging or physical therapy.

## 2022-05-20 NOTE — Progress Notes (Signed)
  Cecillia Menees - 46 y.o. female MRN [id number]  Date of birth: [birth year]  SUBJECTIVE:  Including CC & ROS.  No chief complaint on file.   Federica Allport is a 46 y.o. female that is presenting with acute left foot pain and right wrist pain.  She had a fall recently and is unsure of the exact mechanism of how she fell.  Continues to have left foot pain and right wrist pain.  Having swelling over the lateral aspect of the foot.  Having pain with extension of the wrist.  Review of the emergency department note from 6/4 shows she was placed in an Aircast, crutches and provided pain management. Independent review of the right forearm x-ray from 6/4 shows no acute injuries. Independent review of the left ankle x-ray from 6/4 shows no acute changes. Independent review of the right wrist x-ray from 6/4 shows no acute changes.  Review of Systems See HPI   HISTORY: Past Medical, Surgical, Social, and Family History Reviewed & Updated per EMR.   Pertinent Historical Findings include:  Past Medical History:  Diagnosis Date   PID (pelvic inflammatory disease) 2012    Past Surgical History:  Procedure Laterality Date   CESAREAN SECTION     DILATION AND CURETTAGE OF UTERUS     TONSILLECTOMY     TUBAL LIGATION       PHYSICAL EXAM:  VS: BP 130/80 (BP Location: Left Arm, Patient Position: Sitting)   Ht 5\' 1"  (1.549 m)   Wt 212 lb (96.2 kg)   LMP 04/12/2022   BMI 40.06 kg/m  Physical Exam Gen: NAD, alert, cooperative with exam, well-appearing MSK:  Neurovascularly intact    Limited ultrasound: Left foot and ankle, right wrist:  Left foot and ankle: No changes of the ankle joint. Normal-appearing distal fibula. Normal peroneal tendons. Normal base of the fifth metatarsal. Hyperemia associated with the cuboid no effusion.  Right wrist: No changes distal radius. No changes of the dorsal compartments. No changes within the carpal bones Normal CMC joint  Summary: Findings  suggest a cuboid fracture or contusion.  Ultrasound and interpretation by 06/12/2022, MD    ASSESSMENT & PLAN:   Wrist sprain, right, initial encounter Acutely occurring.  Symptoms seem most consistent with a sprain at this time. -Counseled on home exercise therapy and supportive care. -Mobic. -Could consider physical therapy  Closed nondisplaced fracture of cuboid bone of left foot Acutely occurring.  Symptoms seem most consistent with a cuboid fracture contusion given the pain in that area.  Does have an associated ankle sprain. -Counseled on home exercise therapy and supportive care. -Cam walker. -Could consider further imaging or physical therapy.

## 2022-06-10 ENCOUNTER — Ambulatory Visit (INDEPENDENT_AMBULATORY_CARE_PROVIDER_SITE_OTHER): Payer: BC Managed Care – PPO | Admitting: Family Medicine

## 2022-06-10 ENCOUNTER — Encounter: Payer: Self-pay | Admitting: Family Medicine

## 2022-06-10 DIAGNOSIS — S63501D Unspecified sprain of right wrist, subsequent encounter: Secondary | ICD-10-CM | POA: Diagnosis not present

## 2022-06-10 DIAGNOSIS — S92215D Nondisplaced fracture of cuboid bone of left foot, subsequent encounter for fracture with routine healing: Secondary | ICD-10-CM | POA: Diagnosis not present

## 2022-06-10 NOTE — Assessment & Plan Note (Signed)
No significant pain today.  Having mild swelling over the lateral compartment. -Counseled on home exercise therapy and supportive care. -Counseled on discontinuing the cam walker. -Could consider further imaging or physical therapy.

## 2022-06-10 NOTE — Progress Notes (Signed)
  Stephanie Lee - 46 y.o. female MRN [id number]  Date of birth: November 13, 1976  SUBJECTIVE:  Including CC & ROS.  No chief complaint on file.   Stephanie Lee is a 46 y.o. female that is following up for her right wrist and left foot pain.  Still having some soreness of the right wrist.  Having normal range of motion.  The left foot has improved and has been using the cam walker.   Review of Systems See HPI   HISTORY: Past Medical, Surgical, Social, and Family History Reviewed & Updated per EMR.   Pertinent Historical Findings include:  Past Medical History:  Diagnosis Date   PID (pelvic inflammatory disease) 2012    Past Surgical History:  Procedure Laterality Date   CESAREAN SECTION     DILATION AND CURETTAGE OF UTERUS     TONSILLECTOMY     TUBAL LIGATION       PHYSICAL EXAM:  VS: BP 132/72 (BP Location: Left Arm, Patient Position: Sitting, Cuff Size: Normal)   Ht 5\' 2"  (1.575 m)   Wt 212 lb (96.2 kg)   BMI 38.78 kg/m  Physical Exam Gen: NAD, alert, cooperative with exam, well-appearing MSK:  Neurovascularly intact       ASSESSMENT & PLAN:   Wrist sprain, right, subsequent encounter Having some soreness of the carpal joints.  Has good range of motion and grip strength. -Counseled on home exercise therapy and supportive care. -Could consider further imaging or physical therapy.  Closed nondisplaced fracture of cuboid bone of left foot No significant pain today.  Having mild swelling over the lateral compartment. -Counseled on home exercise therapy and supportive care. -Counseled on discontinuing the cam walker. -Could consider further imaging or physical therapy.

## 2022-06-10 NOTE — Assessment & Plan Note (Signed)
Having some soreness of the carpal joints.  Has good range of motion and grip strength. -Counseled on home exercise therapy and supportive care. -Could consider further imaging or physical therapy.

## 2022-12-08 ENCOUNTER — Encounter: Payer: Self-pay | Admitting: Family Medicine

## 2022-12-08 ENCOUNTER — Ambulatory Visit (INDEPENDENT_AMBULATORY_CARE_PROVIDER_SITE_OTHER): Payer: BC Managed Care – PPO | Admitting: Family Medicine

## 2022-12-08 ENCOUNTER — Ambulatory Visit: Payer: Self-pay

## 2022-12-08 VITALS — BP 130/80 | Ht 61.0 in | Wt 212.0 lb

## 2022-12-08 DIAGNOSIS — M67431 Ganglion, right wrist: Secondary | ICD-10-CM | POA: Diagnosis not present

## 2022-12-08 DIAGNOSIS — G5601 Carpal tunnel syndrome, right upper limb: Secondary | ICD-10-CM | POA: Diagnosis not present

## 2022-12-08 DIAGNOSIS — M79644 Pain in right finger(s): Secondary | ICD-10-CM

## 2022-12-08 MED ORDER — MELOXICAM 15 MG PO TABS: 15.0000 mg | ORAL_TABLET | Freq: Every day | ORAL | 1 refills | Status: DC

## 2022-12-08 NOTE — Patient Instructions (Signed)
Good to see you  Please try the exercises  Please try the brace  Please use the mobic as needed  Please send me a message in MyChart with any questions or updates.  Please see me back in 4 weeks.   --Dr. Raeford Razor

## 2022-12-08 NOTE — Assessment & Plan Note (Signed)
Acute on chronic in nature.  Has had this pain for several months.  Appears to have irritated the metacarpophalangeal joint of the thumb with associated ganglion cyst -Counseled on home exercise therapy and supportive care. -Meloxicam. -Could consider aspiration or injection

## 2022-12-08 NOTE — Assessment & Plan Note (Signed)
Acutely occurring.  Has enlargement on ultrasound with altered sensation in the second through fourth digits. -Counseled on home exercise therapy and supportive care. -Wrist brace -Could consider injection or physical therapy

## 2022-12-08 NOTE — Progress Notes (Signed)
  Stephanie Lee - 47 y.o. female MRN [id number]  Date of birth: [birth year]  SUBJECTIVE:  Including CC & ROS.  No chief complaint on file.   Stephanie Lee is a 47 y.o. female that is  here for right palmer hand pain and right thumb pain. Ongoing for the past few months.    Review of Systems See HPI   HISTORY: Past Medical, Surgical, Social, and Family History Reviewed & Updated per EMR.   Pertinent Historical Findings include:  Past Medical History:  Diagnosis Date   PID (pelvic inflammatory disease) 2012    Past Surgical History:  Procedure Laterality Date   CESAREAN SECTION     DILATION AND CURETTAGE OF UTERUS     TONSILLECTOMY     TUBAL LIGATION       PHYSICAL EXAM:  VS: BP 130/80 (BP Location: Left Arm, Patient Position: Sitting)   Ht 5\' 1"  (1.549 m)   Wt 212 lb (96.2 kg)   BMI 40.06 kg/m  Physical Exam Gen: NAD, alert, cooperative with exam, well-appearing MSK:  Neurovascularly intact    Limited ultrasound: right thumb and hand pain:  Ganglion cyst appreciated at the metacarpophalangeal joint of the thumb. Normal-appearing first dorsal compartment. No change of the CMC joint. Median nerve was measured to be enlarged within the carpal tunnel  Summary: Findings consistent with ganglion cyst of the thumb and carpal tunnel syndrome  Ultrasound and interpretation by Clearance Coots, MD    ASSESSMENT & PLAN:   Carpal tunnel syndrome, right Acutely occurring.  Has enlargement on ultrasound with altered sensation in the second through fourth digits. -Counseled on home exercise therapy and supportive care. 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  Has had this pain for several months.  Appears to have irritated the metacarpophalangeal joint of the thumb with associated ganglion cyst -Counseled on home exercise therapy and supportive care. -Meloxicam. -Could  consider aspiration or injection

## 2023-01-05 ENCOUNTER — Ambulatory Visit: Payer: BC Managed Care – PPO | Admitting: Family Medicine

## 2023-01-05 DIAGNOSIS — L280 Lichen simplex chronicus: Secondary | ICD-10-CM | POA: Diagnosis not present

## 2023-01-13 ENCOUNTER — Ambulatory Visit: Payer: BC Managed Care – PPO | Admitting: Family Medicine

## 2023-03-21 ENCOUNTER — Encounter: Payer: Self-pay | Admitting: *Deleted

## 2023-04-06 DIAGNOSIS — M792 Neuralgia and neuritis, unspecified: Secondary | ICD-10-CM | POA: Diagnosis not present

## 2023-04-18 DIAGNOSIS — L661 Lichen planopilaris: Secondary | ICD-10-CM | POA: Diagnosis not present

## 2023-04-18 DIAGNOSIS — L219 Seborrheic dermatitis, unspecified: Secondary | ICD-10-CM | POA: Diagnosis not present

## 2023-04-18 DIAGNOSIS — L089 Local infection of the skin and subcutaneous tissue, unspecified: Secondary | ICD-10-CM | POA: Diagnosis not present

## 2023-04-18 DIAGNOSIS — L669 Cicatricial alopecia, unspecified: Secondary | ICD-10-CM | POA: Diagnosis not present

## 2023-05-10 ENCOUNTER — Encounter: Payer: Self-pay | Admitting: Family Medicine

## 2023-05-10 ENCOUNTER — Ambulatory Visit (INDEPENDENT_AMBULATORY_CARE_PROVIDER_SITE_OTHER): Payer: BC Managed Care – PPO | Admitting: Family Medicine

## 2023-05-10 VITALS — BP 120/68 | Ht 61.0 in | Wt 212.0 lb

## 2023-05-10 DIAGNOSIS — M67441 Ganglion, right hand: Secondary | ICD-10-CM

## 2023-05-10 NOTE — Patient Instructions (Signed)
Call me if this comes back and continues to be painful. Next step would be to refer you to a hand surgeon to discuss removal of the cyst.

## 2023-05-11 ENCOUNTER — Encounter: Payer: Self-pay | Admitting: Family Medicine

## 2023-05-11 NOTE — Progress Notes (Signed)
PCP: Gillian Scarce, MD  Subjective:   HPI: Patient is a 47 y.o. female here for right thumb.  Patient returns after her visit in January with continued pain and swelling about the dorsal right thumb. She has continued to do wrist braces at nighttime for presumed carpal tunnel as well. While this is still bothering her the majority of her pain is coming about the first MCP joint area where she has a mobile cyst that is tender. No redness, fever, new trauma. She would like to talk about treatment options for this.  Past Medical History:  Diagnosis Date   PID (pelvic inflammatory disease) 2012    Current Outpatient Medications on File Prior to Visit  Medication Sig Dispense Refill   Acetaminophen 500 MG capsule Take 500-1,000 mg by mouth every 6 (six) hours as needed (for pain).     meloxicam (MOBIC) 15 MG tablet Take 1 tablet (15 mg total) by mouth daily. 60 tablet 1   methocarbamol (ROBAXIN) 500 MG tablet Take 1 tablet (500 mg total) by mouth 2 (two) times daily. 20 tablet 0   triamcinolone cream (KENALOG) 0.1 % Apply 1 application on to the skin 2 (two) times daily. 30 g 0   No current facility-administered medications on file prior to visit.    Past Surgical History:  Procedure Laterality Date   CESAREAN SECTION     DILATION AND CURETTAGE OF UTERUS     TONSILLECTOMY     TUBAL LIGATION      No Known Allergies  BP 120/68 (BP Location: Left Arm, Patient Position: Sitting)   Ht 5\' 1"  (1.549 m)   Wt 212 lb (96.2 kg)   BMI 40.06 kg/m       No data to display              No data to display              Objective:  Physical Exam:  Gen: NAD, comfortable in exam room  Right thumb: Very small mobile mass on the radial side of the dorsal first MCP joint.  No overlying erythema, bruising, other deformity. Tenderness to palpation of the mass noted above. No other tenderness. Full range of motion all digits including at the first digit MCP, IP  joint. Collateral ligaments intact at the first MCP. Neurovascular intact distally. Negative Tinel's at the carpal tunnel.  Limited musculoskeletal ultrasound right thumb: Small anechoic structure dorsally over the first MCP joint with extension into this joint.  Cyst measures 0.74 cm x 0.29 cm x 0.63 cm, no significant change compared to January.  No vascularity of the structure.  Assessment & Plan:  1.  Right thumb pain: Secondary to small ganglion cyst arising from the first MCP joint.  This is not improved with watchful waiting, bracing.  We discussed options and she would like this aspirated which was performed today under ultrasound guidance.  Complete collapse of the cyst was confirmed with ultrasound afterwards.  She will follow-up with [place] as needed.  Consider referral to hand surgeon if this recurs.  After informed written consent patient was seated in chair in exam room.  Timeout was performed.  Area overlying ganglion cyst of first digit of right hand was prepped with alcohol swabs.  A small amount of lidocaine was used for local anesthesia.  Then utilizing ultrasound guidance, the ganglion cyst was aspirated with a tiny amount of gelatinous fluid extracted.  Patient tolerated procedure without immediate complications.

## 2023-06-22 ENCOUNTER — Ambulatory Visit: Payer: BC Managed Care – PPO | Admitting: Orthopaedic Surgery

## 2023-06-22 ENCOUNTER — Other Ambulatory Visit (INDEPENDENT_AMBULATORY_CARE_PROVIDER_SITE_OTHER): Payer: BC Managed Care – PPO

## 2023-06-22 DIAGNOSIS — M1712 Unilateral primary osteoarthritis, left knee: Secondary | ICD-10-CM | POA: Diagnosis not present

## 2023-06-22 DIAGNOSIS — G5601 Carpal tunnel syndrome, right upper limb: Secondary | ICD-10-CM

## 2023-06-22 MED ORDER — BUPIVACAINE HCL 0.5 % IJ SOLN
2.0000 mL | INTRAMUSCULAR | Status: AC | PRN
  Administered 2023-06-22: 2 mL via INTRA_ARTICULAR

## 2023-06-22 MED ORDER — BUPIVACAINE HCL 0.5 % IJ SOLN
1.0000 mL | INTRAMUSCULAR | Status: AC | PRN
  Administered 2023-06-22: 1 mL

## 2023-06-22 MED ORDER — LIDOCAINE HCL 1 % IJ SOLN
1.0000 mL | INTRAMUSCULAR | Status: AC | PRN
  Administered 2023-06-22: 1 mL

## 2023-06-22 MED ORDER — METHYLPREDNISOLONE ACETATE 40 MG/ML IJ SUSP
40.0000 mg | INTRAMUSCULAR | Status: AC | PRN
  Administered 2023-06-22: 40 mg

## 2023-06-22 MED ORDER — METHYLPREDNISOLONE ACETATE 40 MG/ML IJ SUSP
40.0000 mg | INTRAMUSCULAR | Status: AC | PRN
  Administered 2023-06-22: 40 mg via INTRA_ARTICULAR

## 2023-06-22 MED ORDER — LIDOCAINE HCL 1 % IJ SOLN
2.0000 mL | INTRAMUSCULAR | Status: AC | PRN
  Administered 2023-06-22: 2 mL

## 2023-06-22 NOTE — Progress Notes (Signed)
Office Visit Note   Patient: Stephanie Lee           Date of Birth: [birth year]           MRN: [id number] Visit Date: 06/22/2023              Requested by: Gillian Scarce, MD [address] [place],  [place] [address] PCP: Gillian Scarce, MD   Assessment & Plan: Visit Diagnoses:  1. Unilateral primary osteoarthritis, left knee   2. Right carpal tunnel syndrome     Plan: Impression is 47 year old female with left knee osteoarthritis and worsening right carpal tunnel syndrome.  We discussed that it is possible she may have a degenerative medial meniscal tear as well.  She is not really reporting mechanical symptoms.  No we did a cortisone injection today.  For the carpal tunnel syndrome her symptoms have gotten worse.  She elected to undergo a carpal tunnel injection today.  We will also obtain EMGs to assess severity and to establish a baseline.  Follow-Up Instructions: No follow-ups on file.   Orders:  Orders Placed This Encounter  Procedures   Large Joint Inj: L knee   Hand/UE Inj: R carpal tunnel   XR KNEE 3 VIEW LEFT   Ambulatory referral to Physical Medicine Rehab   No orders of the defined types were placed in this encounter.     Procedures: Large Joint Inj: L knee on 06/22/2023 9:35 AM Details: 22 G needle Medications: 2 mL bupivacaine 0.5 %; 2 mL lidocaine 1 %; 40 mg methylPREDNISolone acetate 40 MG/ML Outcome: tolerated well, no immediate complications Patient was prepped and draped in the usual sterile fashion.    Hand/UE Inj: R carpal tunnel for carpal tunnel syndrome on 06/22/2023 9:35 AM Indications: pain Details: 25 G needle Medications: 1 mL lidocaine 1 %; 1 mL bupivacaine 0.5 %; 40 mg methylPREDNISolone acetate 40 MG/ML Outcome: tolerated well, no immediate complications Patient was prepped and draped in the usual sterile fashion.       Clinical Data: No additional findings.   Subjective: Chief Complaint  Patient presents with   Left Knee  - Pain    HPI [name] is a 47 year old female here for evaluation of left knee pain and right hand numbness and tingling in her hand.  She wears a carpal tunnel brace to work.  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  She is referral from [facility] outpatient sports medicine.  She is also here for evaluation of left knee pain that started about 2 weeks ago.  Denies any injuries.  She feels swelling and stiffness and medial sided pain.  Denies any mechanical symptoms. Review of Systems  Constitutional: Negative.   HENT: Negative.    Eyes: Negative.   Respiratory: Negative.    Cardiovascular: Negative.   Endocrine: Negative.   Musculoskeletal: Negative.   Neurological: Negative.   Hematological: Negative.   Psychiatric/Behavioral: Negative.    All other systems reviewed and are negative.    Objective: Vital Signs: There were no vitals taken for this visit.  Physical Exam Vitals and nursing note reviewed.  Constitutional:      Appearance: She is well-developed.  HENT:     Head: Atraumatic.     Nose: Nose normal.  Eyes:     Extraocular Movements: Extraocular movements intact.  Cardiovascular:     Pulses: Normal pulses.  Pulmonary:     Effort: Pulmonary effort is normal.  Abdominal:     Palpations: Abdomen is soft.  Musculoskeletal:     Cervical back: Neck supple.  Skin:    General: Skin is warm.     Capillary Refill: Capillary refill takes less than 2 seconds.  Neurological:     Mental Status: She is alert. Mental status is at baseline.  Psychiatric:        Behavior: Behavior normal.        Thought Content: Thought content normal.        Judgment: Judgment normal.     Ortho Exam Examination of the left knee shows a trace effusion.  No real joint line tenderness.  Normal range of motion.  Collaterals and cruciates are stable.  Examination right hand shows no muscle atrophy.  Negative Tinel at the carpal tunnel.  Negative carpal tunnel compressive signs. Specialty Comments:   No specialty comments available.  Imaging: XR KNEE 3 VIEW LEFT  Result Date: 06/22/2023 X-rays demonstrate slightly decreased medial joint space.  Periarticular spurring.    PMFS History: Patient Active Problem List   Diagnosis Date Noted   Ganglion cyst of volar aspect of right wrist 12/08/2022   Carpal tunnel syndrome, right 12/08/2022   Wrist sprain, right, subsequent encounter 05/20/2022   Closed nondisplaced fracture of cuboid bone of left foot 05/20/2022   PID (acute pelvic inflammatory disease) 08/15/2011    Class: Acute   Acute pelvic pain, female 08/14/2011   Past Medical History:  Diagnosis Date   PID (pelvic inflammatory disease) 2012    Family History  Problem Relation Age of Onset   Arthritis Mother    Hypertension Mother    Arthritis Father    Cancer Father    Hypertension Sister    Stroke Sister    Kidney disease Sister    Other Neg Hx    Breast cancer Neg Hx     Past Surgical History:  Procedure Laterality Date   CESAREAN SECTION     DILATION AND CURETTAGE OF UTERUS     TONSILLECTOMY     TUBAL LIGATION     Social History   Occupational History   Not on file  Tobacco Use   Smoking status: Never   Smokeless tobacco: Never  Vaping Use   Vaping status: Never Used  Substance and Sexual Activity   Alcohol use: No   Drug use: No   Sexual activity: Yes    Birth control/protection: Surgical

## 2023-06-24 ENCOUNTER — Telehealth: Payer: Self-pay | Admitting: Orthopaedic Surgery

## 2023-06-24 NOTE — Telephone Encounter (Signed)
Patient called.she is having numbness in her fingers. Her cb# [phone]

## 2023-06-24 NOTE — Telephone Encounter (Signed)
Spoke with patient and scheduled NCV for 06/29/23.

## 2023-06-27 ENCOUNTER — Telehealth: Payer: Self-pay | Admitting: Orthopaedic Surgery

## 2023-06-27 ENCOUNTER — Other Ambulatory Visit: Payer: Self-pay | Admitting: Physician Assistant

## 2023-06-27 MED ORDER — TRAMADOL HCL 50 MG PO TABS: 50.0000 mg | ORAL_TABLET | Freq: Two times a day (BID) | ORAL | 0 refills | Status: DC | PRN

## 2023-06-27 MED ORDER — PREDNISONE 10 MG (21) PO TBPK: ORAL_TABLET | ORAL | 0 refills | Status: DC

## 2023-06-27 NOTE — Telephone Encounter (Signed)
Spoke to patient in the office today and discussed with dr. August Saucer.  Motor function is intact.  Sensation is there in the median nerve distribution, but decreased.  Ok to watch and proceed with emg Wednesday.  Have sent in steroids and pain meds

## 2023-06-27 NOTE — Telephone Encounter (Signed)
Patient called. Says her hand is still numbness.

## 2023-06-29 ENCOUNTER — Ambulatory Visit (INDEPENDENT_AMBULATORY_CARE_PROVIDER_SITE_OTHER): Payer: BC Managed Care – PPO | Admitting: Physical Medicine and Rehabilitation

## 2023-06-29 DIAGNOSIS — R202 Paresthesia of skin: Secondary | ICD-10-CM | POA: Diagnosis not present

## 2023-06-29 DIAGNOSIS — M67431 Ganglion, right wrist: Secondary | ICD-10-CM

## 2023-06-29 DIAGNOSIS — M79641 Pain in right hand: Secondary | ICD-10-CM | POA: Diagnosis not present

## 2023-06-29 NOTE — Progress Notes (Signed)
Functional Pain Scale - descriptive words and definitions  Mild (2)   Noticeable when not distracted/no impact on ADL's/sleep only slightly affected and able to   use both passive and active distraction for comfort. Mild range order  Average Pain 0  Right handed. Numbness in right hand in all fingertips except pinky. Patient had a carpal tunnel injection 7 days ago and she stated the hand is more numb now than before she got it done

## 2023-07-01 NOTE — Procedures (Unsigned)
EMG & NCV Findings: Evaluation of the right median motor nerve showed prolonged distal onset latency (5.5 ms) and decreased conduction velocity (Elbow-Wrist, 47 m/s).  The right median (across palm) sensory nerve showed prolonged distal peak latency (Wrist, 6.6 ms), reduced amplitude (5.6 V), and prolonged distal peak latency (Palm, 2.8 ms).  All remaining nerves (as indicated in the following tables) were within normal limits.    All examined muscles (as indicated in the following table) showed no evidence of electrical instability.    Impression: The above electrodiagnostic study is ABNORMAL and reveals evidence of a moderate to severe right median nerve entrapment at the wrist (carpal tunnel syndrome) affecting sensory and motor components.   There is no significant electrodiagnostic evidence of any other focal nerve entrapment, brachial plexopathy or cervical radiculopathy.   Recommendations: 1.  Follow-up with referring physician. 2.  Continue current management of symptoms.  She reports worsening symptoms after injection. 3.  Suggest surgical evaluation.  ___________________________ Naaman Plummer FAAPMR Board Certified, American Board of Physical Medicine and Rehabilitation    Nerve Conduction Studies Anti Sensory Summary Table   Stim Site NR Peak (ms) Norm Peak (ms) P-T Amp (V) Norm P-T Amp Site1 Site2 Delta-P (ms) Dist (cm) Vel (m/s) Norm Vel (m/s)  Right Median Acr Palm Anti Sensory (2nd Digit)  31.1C  Wrist    *6.6 <3.6 *5.6 >10 Wrist Palm 3.8 0.0    Palm    *2.8 <2.0 0.6         Right Radial Anti Sensory (Base 1st Digit)  31.2C  Wrist    2.0 <3.1 24.3  Wrist Base 1st Digit 2.0 0.0    Right Ulnar Anti Sensory (5th Digit)  31.4C  Wrist    3.0 <3.7 22.9 >15.0 Wrist 5th Digit 3.0 14.0 47 >38   Motor Summary Table   Stim Site NR Onset (ms) Norm Onset (ms) O-P Amp (mV) Norm O-P Amp Site1 Site2 Delta-0 (ms) Dist (cm) Vel (m/s) Norm Vel (m/s)  Right Median Motor (Abd Poll  Brev)  31.5C  Wrist    *5.5 <4.2 10.5 >5 Elbow Wrist 4.3 20.0 *47 >50  Elbow    9.8  10.5         Right Ulnar Motor (Abd Dig Min)  31.5C  Wrist    2.8 <4.2 8.2 >3 B Elbow Wrist 3.5 19.5 56 >53  B Elbow    6.3  6.6  A Elbow B Elbow 1.0 11.0 110 >53  A Elbow    7.3  7.1          EMG   Side Muscle Nerve Root Ins Act Fibs Psw Amp Dur Poly Recrt Int Dennie Bible Comment  Right Abd Poll Brev Median C8-T1 Nml Nml Nml Nml Nml 0 Nml Nml   Right 1stDorInt Ulnar C8-T1 Nml Nml Nml Nml Nml 0 Nml Nml   Right PronatorTeres Median C6-7 Nml Nml Nml Nml Nml 0 Nml Nml   Right Biceps Musculocut C5-6 Nml Nml Nml Nml Nml 0 Nml Nml   Right Deltoid Axillary C5-6 Nml Nml Nml Nml Nml 0 Nml Nml     Nerve Conduction Studies Anti Sensory Left/Right Comparison   Stim Site L Lat (ms) R Lat (ms) L-R Lat (ms) L Amp (V) R Amp (V) L-R Amp (%) Site1 Site2 L Vel (m/s) R Vel (m/s) L-R Vel (m/s)  Median Acr Palm Anti Sensory (2nd Digit)  31.1C  Wrist  *6.6   *5.6  Wrist Palm     Palm  *  2.8   0.6        Radial Anti Sensory (Base 1st Digit)  31.2C  Wrist  2.0   24.3  Wrist Base 1st Digit     Ulnar Anti Sensory (5th Digit)  31.4C  Wrist  3.0   22.9  Wrist 5th Digit  47    Motor Left/Right Comparison   Stim Site L Lat (ms) R Lat (ms) L-R Lat (ms) L Amp (mV) R Amp (mV) L-R Amp (%) Site1 Site2 L Vel (m/s) R Vel (m/s) L-R Vel (m/s)  Median Motor (Abd Poll Brev)  31.5C  Wrist  *5.5   10.5  Elbow Wrist  *47   Elbow  9.8   10.5        Ulnar Motor (Abd Dig Min)  31.5C  Wrist  2.8   8.2  B Elbow Wrist  56   B Elbow  6.3   6.6  A Elbow B Elbow  110   A Elbow  7.3   7.1           Waveforms:

## 2023-07-05 ENCOUNTER — Encounter: Payer: Self-pay | Admitting: Physical Medicine and Rehabilitation

## 2023-07-05 NOTE — Progress Notes (Signed)
Stephanie Lee - 47 y.o. female MRN [id number]  Date of birth: [birth year]  Office Visit Note: Visit Date: 06/29/2023 PCP: Gillian Scarce, MD Referred by: Tarry Kos, MD  Subjective: Chief Complaint  Patient presents with   Right Hand - Numbness   HPI:  Stephanie Lee is a 47 y.o. female who comes in today at the request of Dr. Glee Arvin for evaluation and management of chronic, worsening and severe pain, numbness and tingling in the Right upper extremities.  Patient is Right hand dominant.  Her history is such that about a year ago she had a fall with right wrist sprain strain that ultimately was treated by Dr. Pearletha Forge and sports medicine.  She is found to have a ganglion cyst and pain at the thumb and continued numbness and tingling in the hand.  It was felt she may have had carpal tunnel syndrome concomitant with that.  She denies left-sided complaints.  She has been wearing a brace and has had anti-inflammatories and muscle relaxers etc. without any relief she has also had a course of steroids without relief.  She comments that she has numbness in all of the fingertips mainly except for the fifth digit.  Endorses the hand at times.  She reports that after seeing Dr. Roda Shutters they decided to look at possible injection of the carpal tunnel and after that injection she reports now more numbness than before the injection.  She has had no prior electrodiagnostic studies.  No frank radicular symptoms but some neck pain.  She has had nocturnal complaints and some complaints with holding objects particularly cell phone.   I spent more than 30 minutes speaking face-to-face with the patient with 50% of the time in counseling and discussing coordination of care.    Review of Systems  Musculoskeletal:  Positive for joint pain.  Neurological:  Positive for tingling and weakness.  All other systems reviewed and are negative.  Otherwise per HPI.  Assessment & Plan: Visit Diagnoses:    ICD-10-CM    1. Paresthesia of skin  R20.2 NCV with EMG (electromyography)    2. Ganglion cyst of volar aspect of right wrist  M67.431     3. Pain in right hand  M79.641       Plan: Impression: Differential diagnosis is musculoskeletal pain and arthritic pain with possible carpal tunnel syndrome versus radiculopathy.  Electrodiagnostic study performed.  The above electrodiagnostic study is ABNORMAL and reveals evidence of a moderate to severe right median nerve entrapment at the wrist (carpal tunnel syndrome) affecting sensory and motor components.   There is no significant electrodiagnostic evidence of any other focal nerve entrapment, brachial plexopathy or cervical radiculopathy.   Recommendations: 1.  Follow-up with referring physician. 2.  Continue current management of symptoms.  She reports worsening symptoms after injection. 3.  Suggest surgical evaluation.  Meds & Orders: No orders of the defined types were placed in this encounter.   Orders Placed This Encounter  Procedures   NCV with EMG (electromyography)    Follow-up: No follow-ups on file.   Procedures: No procedures performed  EMG & NCV Findings: Evaluation of the right median motor nerve showed prolonged distal onset latency (5.5 ms) and decreased conduction velocity (Elbow-Wrist, 47 m/s).  The right median (across palm) sensory nerve showed prolonged distal peak latency (Wrist, 6.6 ms), reduced amplitude (5.6 V), and prolonged distal peak latency (Palm, 2.8 ms).  All remaining nerves (as indicated in the following tables) were within normal limits.  All examined muscles (as indicated in the following table) showed no evidence of electrical instability.    Impression: The above electrodiagnostic study is ABNORMAL and reveals evidence of a moderate to severe right median nerve entrapment at the wrist (carpal tunnel syndrome) affecting sensory and motor components.   There is no significant electrodiagnostic evidence of any  other focal nerve entrapment, brachial plexopathy or cervical radiculopathy.   Recommendations: 1.  Follow-up with referring physician. 2.  Continue current management of symptoms.  She reports worsening symptoms after injection. 3.  Suggest surgical evaluation.  ___________________________ Naaman Plummer FAAPMR Board Certified, American Board of Physical Medicine and Rehabilitation    Nerve Conduction Studies Anti Sensory Summary Table   Stim Site NR Peak (ms) Norm Peak (ms) P-T Amp (V) Norm P-T Amp Site1 Site2 Delta-P (ms) Dist (cm) Vel (m/s) Norm Vel (m/s)  Right Median Acr Palm Anti Sensory (2nd Digit)  31.1C  Wrist    *6.6 <3.6 *5.6 >10 Wrist Palm 3.8 0.0    Palm    *2.8 <2.0 0.6         Right Radial Anti Sensory (Base 1st Digit)  31.2C  Wrist    2.0 <3.1 24.3  Wrist Base 1st Digit 2.0 0.0    Right Ulnar Anti Sensory (5th Digit)  31.4C  Wrist    3.0 <3.7 22.9 >15.0 Wrist 5th Digit 3.0 14.0 47 >38   Motor Summary Table   Stim Site NR Onset (ms) Norm Onset (ms) O-P Amp (mV) Norm O-P Amp Site1 Site2 Delta-0 (ms) Dist (cm) Vel (m/s) Norm Vel (m/s)  Right Median Motor (Abd Poll Brev)  31.5C  Wrist    *5.5 <4.2 10.5 >5 Elbow Wrist 4.3 20.0 *47 >50  Elbow    9.8  10.5         Right Ulnar Motor (Abd Dig Min)  31.5C  Wrist    2.8 <4.2 8.2 >3 B Elbow Wrist 3.5 19.5 56 >53  B Elbow    6.3  6.6  A Elbow B Elbow 1.0 11.0 110 >53  A Elbow    7.3  7.1          EMG   Side Muscle Nerve Root Ins Act Fibs Psw Amp Dur Poly Recrt Int Dennie Bible Comment  Right Abd Poll Brev Median C8-T1 Nml Nml Nml Nml Nml 0 Nml Nml   Right 1stDorInt Ulnar C8-T1 Nml Nml Nml Nml Nml 0 Nml Nml   Right PronatorTeres Median C6-7 Nml Nml Nml Nml Nml 0 Nml Nml   Right Biceps Musculocut C5-6 Nml Nml Nml Nml Nml 0 Nml Nml   Right Deltoid Axillary C5-6 Nml Nml Nml Nml Nml 0 Nml Nml     Nerve Conduction Studies Anti Sensory Left/Right Comparison   Stim Site L Lat (ms) R Lat (ms) L-R Lat (ms) L Amp (V) R Amp (V)  L-R Amp (%) Site1 Site2 L Vel (m/s) R Vel (m/s) L-R Vel (m/s)  Median Acr Palm Anti Sensory (2nd Digit)  31.1C  Wrist  *6.6   *5.6  Wrist Palm     Palm  *2.8   0.6        Radial Anti Sensory (Base 1st Digit)  31.2C  Wrist  2.0   24.3  Wrist Base 1st Digit     Ulnar Anti Sensory (5th Digit)  31.4C  Wrist  3.0   22.9  Wrist 5th Digit  47    Motor Left/Right Comparison   Stim Site L Lat (ms)  R Lat (ms) L-R Lat (ms) L Amp (mV) R Amp (mV) L-R Amp (%) Site1 Site2 L Vel (m/s) R Vel (m/s) L-R Vel (m/s)  Median Motor (Abd Poll Brev)  31.5C  Wrist  *5.5   10.5  Elbow Wrist  *47   Elbow  9.8   10.5        Ulnar Motor (Abd Dig Min)  31.5C  Wrist  2.8   8.2  B Elbow Wrist  56   B Elbow  6.3   6.6  A Elbow B Elbow  110   A Elbow  7.3   7.1           Waveforms:             Clinical History: No specialty comments available.     Objective:  VS:  HT:    WT:   BMI:     BP:   HR: bpm  TEMP: ( )  RESP:  Physical Exam Vitals and nursing note reviewed.  Constitutional:      General: She is not in acute distress.    Appearance: Normal appearance. She is well-developed. She is not ill-appearing.  HENT:     Head: Normocephalic and atraumatic.  Eyes:     Conjunctiva/sclera: Conjunctivae normal.     Pupils: Pupils are equal, round, and reactive to light.  Cardiovascular:     Rate and Rhythm: Normal rate.     Pulses: Normal pulses.  Pulmonary:     Effort: Pulmonary effort is normal.  Musculoskeletal:        General: No swelling, tenderness or deformity.     Right lower leg: No edema.     Left lower leg: No edema.     Comments: Inspection reveals no atrophy of the bilateral APB or FDI or hand intrinsics. There is no swelling, color changes, allodynia or dystrophic changes. There is 5 out of 5 strength in the bilateral wrist extension, finger abduction and long finger flexion. There is intact sensation to light touch in all dermatomal and peripheral nerve distributions.  There is a  positive Phalen's test on the right. There is a negative Hoffmann's test bilaterally.  Skin:    General: Skin is warm and dry.     Findings: No erythema or rash.  Neurological:     General: No focal deficit present.     Mental Status: She is alert and oriented to person, place, and time.     Sensory: No sensory deficit.     Motor: No weakness or abnormal muscle tone.     Coordination: Coordination normal.     Gait: Gait normal.  Psychiatric:        Mood and Affect: Mood normal.        Behavior: Behavior normal.      Imaging: No results found.

## 2023-07-06 ENCOUNTER — Ambulatory Visit (INDEPENDENT_AMBULATORY_CARE_PROVIDER_SITE_OTHER): Payer: BC Managed Care – PPO | Admitting: Orthopaedic Surgery

## 2023-07-06 DIAGNOSIS — G5601 Carpal tunnel syndrome, right upper limb: Secondary | ICD-10-CM | POA: Diagnosis not present

## 2023-07-06 NOTE — Progress Notes (Signed)
   Office Visit Note   Patient: Stephanie Lee           Date of Birth: 03-Jul-1976           MRN: [id number] Visit Date: 07/06/2023              Requested by: Gillian Scarce, MD [address] [place],  [place] [address] PCP: Gillian Scarce, MD   Assessment & Plan: Visit Diagnoses:  1. Right carpal tunnel syndrome     Plan: Leetta is a 47 year old female with moderate to severe right carpal tunnel syndrome.  Cortisone injection was not very effective.  Based on these findings I recommended a right carpal tunnel release and she agrees.  Risk benefits prognosis of the surgery reviewed with the patient.  We will delay the surgery another month due to the recent injection.  Eunice Blase will call the patient to confirm surgery date.  Follow-Up Instructions: No follow-ups on file.   Orders:  No orders of the defined types were placed in this encounter.  No orders of the defined types were placed in this encounter.     Procedures: No procedures performed   Clinical Data: No additional findings.   Subjective: Chief Complaint  Patient presents with   Right Hand - Follow-up    HPI Ratzy returns today to review EMGs.  Denies any changes in symptoms.  Review of Systems   Objective: Vital Signs: There were no vitals taken for this visit.  Physical Exam  Ortho Exam Hand exam is unchanged.  Specialty Comments:  No specialty comments available.  Imaging: No results found.   PMFS History: Patient Active Problem List   Diagnosis Date Noted   Ganglion cyst of volar aspect of right wrist 12/08/2022   Carpal tunnel syndrome, right 12/08/2022   Wrist sprain, right, subsequent encounter 05/20/2022   Closed nondisplaced fracture of cuboid bone of left foot 05/20/2022   PID (acute pelvic inflammatory disease) 08/15/2011    Class: Acute   Acute pelvic pain, female 08/14/2011   Past Medical History:  Diagnosis Date   PID (pelvic inflammatory disease) 2012    Family  History  Problem Relation Age of Onset   Arthritis Mother    Hypertension Mother    Arthritis Father    Cancer Father    Hypertension Sister    Stroke Sister    Kidney disease Sister    Other Neg Hx    Breast cancer Neg Hx     Past Surgical History:  Procedure Laterality Date   CESAREAN SECTION     DILATION AND CURETTAGE OF UTERUS     TONSILLECTOMY     TUBAL LIGATION     Social History   Occupational History   Not on file  Tobacco Use   Smoking status: Never   Smokeless tobacco: Never  Vaping Use   Vaping status: Never Used  Substance and Sexual Activity   Alcohol use: No   Drug use: No   Sexual activity: Yes    Birth control/protection: Surgical

## 2023-07-07 ENCOUNTER — Ambulatory Visit: Payer: BC Managed Care – PPO | Admitting: Orthopaedic Surgery

## 2024-05-15 ENCOUNTER — Emergency Department (HOSPITAL_COMMUNITY)
Admission: EM | Admit: 2024-05-15 | Discharge: 2024-05-16 | Disposition: A | Discharge status: Home / Self Care | Attending: Emergency Medicine | Admitting: Emergency Medicine

## 2024-05-15 ENCOUNTER — Emergency Department (HOSPITAL_COMMUNITY)

## 2024-05-15 ENCOUNTER — Encounter (HOSPITAL_COMMUNITY): Payer: Self-pay

## 2024-05-15 ENCOUNTER — Other Ambulatory Visit: Payer: Self-pay

## 2024-05-15 DIAGNOSIS — R1031 Right lower quadrant pain: Secondary | ICD-10-CM | POA: Diagnosis present

## 2024-05-15 DIAGNOSIS — N83201 Unspecified ovarian cyst, right side: Secondary | ICD-10-CM | POA: Insufficient documentation

## 2024-05-15 LAB — URINALYSIS, ROUTINE W REFLEX MICROSCOPIC
Bilirubin Urine: NEGATIVE
Glucose, UA: NEGATIVE mg/dL
Hgb urine dipstick: NEGATIVE
Ketones, ur: NEGATIVE mg/dL
Leukocytes,Ua: NEGATIVE
Nitrite: NEGATIVE
Protein, ur: NEGATIVE mg/dL
Specific Gravity, Urine: 1.014 (ref 1.005–1.030)
pH: 5 (ref 5.0–8.0)

## 2024-05-15 LAB — COMPREHENSIVE METABOLIC PANEL WITH GFR
ALT: 16 U/L (ref 0–44)
AST: 20 U/L (ref 15–41)
Albumin: 3.6 g/dL (ref 3.5–5.0)
Alkaline Phosphatase: 54 U/L (ref 38–126)
Anion gap: 8 (ref 5–15)
BUN: 8 mg/dL (ref 6–20)
CO2: 24 mmol/L (ref 22–32)
Calcium: 8.9 mg/dL (ref 8.9–10.3)
Chloride: 105 mmol/L (ref 98–111)
Creatinine, Ser: 0.81 mg/dL (ref 0.44–1.00)
GFR, Estimated: >60 mL/min (ref >60)
Glucose, Bld: 97 mg/dL (ref 70–99)
Potassium: 3.7 mmol/L (ref 3.5–5.1)
Sodium: 137 mmol/L (ref 135–145)
Total Bilirubin: 0.4 mg/dL (ref 0.0–1.2)
Total Protein: 6.5 g/dL (ref 6.5–8.1)

## 2024-05-15 LAB — CBC
HCT: 36.6 % (ref 36.0–46.0)
Hemoglobin: 12.2 g/dL (ref 12.0–15.0)
MCH: 29.6 pg (ref 26.0–34.0)
MCHC: 33.3 g/dL (ref 30.0–36.0)
MCV: 88.8 fL (ref 80.0–100.0)
Platelets: 335 10*3/uL (ref 150–400)
RBC: 4.12 MIL/uL (ref 3.87–5.11)
RDW: 13.3 % (ref 11.5–15.5)
WBC: 9.1 10*3/uL (ref 4.0–10.5)
nRBC: 0 % (ref 0.0–0.2)

## 2024-05-15 LAB — LIPASE, BLOOD: Lipase: 35 U/L (ref 11–51)

## 2024-05-15 LAB — HCG, SERUM, QUALITATIVE: Preg, Serum: NEGATIVE

## 2024-05-15 MED ORDER — IOHEXOL 350 MG/ML SOLN
60.0000 mL | Freq: Once | INTRAVENOUS | Status: AC | PRN
  Administered 2024-05-15: 60 mL via INTRAVENOUS

## 2024-05-15 MED ORDER — KETOROLAC TROMETHAMINE 15 MG/ML IJ SOLN
15.0000 mg | Freq: Once | INTRAMUSCULAR | Status: AC
  Administered 2024-05-15: 15 mg via INTRAVENOUS
  Filled 2024-05-15: qty 1

## 2024-05-15 NOTE — ED Provider Notes (Signed)
 [place] EMERGENCY DEPARTMENT AT [facility] Provider Note   CSN: [id number] Arrival date & time: 05/15/24  2026     History  Chief Complaint  Patient presents with   Abdominal Pain    Stephanie Lee is a 48 y.o. female with medical history of PID in 2012, tubal ligation.  Patient presents to ED for evaluation of right lower quadrant abdominal pain.  She reports that beginning last night before bed she developed pain in her right lower quadrant that does not radiate.  She states that the pain slightly reduced overnight but around 10 AM today she had an increase in the pain.  She describes the pain as throbbing and reports it is located in her right lower quadrant.  She denies any nausea, vomiting, diarrhea at home.  Denies fevers.  Denies dysuria or flank pain.  Reports that her appetite has been sufficient, she is still eating and drinking.  She states she has a history of PID in 2012 where she was admitted to [facility].  She denies any vaginal discharge, concern for STI.  She is unsure of her last menstrual cycle reports it is sometime in the last 3 weeks.   Abdominal Pain      Home Medications Prior to Admission medications   Medication Sig Start Date End Date Taking? Authorizing Provider  predniSONE  (STERAPRED UNI-PAK 21 TAB) 10 MG (21) TBPK tablet Take as directed 06/27/23   Sandie Cross, PA-C  traMADol  (ULTRAM ) 50 MG tablet Take 1 tablet (50 mg total) by mouth 2 (two) times daily as needed. 06/27/23   Sandie Cross, PA-C  Acetaminophen  500 MG capsule Take 500-1,000 mg by mouth every 6 (six) hours as needed (for pain).    [provider]  meloxicam  (MOBIC ) 15 MG tablet Take 1 tablet (15 mg total) by mouth daily. 12/08/22   Margaree Shark, MD  methocarbamol  (ROBAXIN ) 500 MG tablet Take 1 tablet (500 mg total) by mouth 2 (two) times daily. 05/09/22   Henderly, Britni A, PA-C  triamcinolone  cream (KENALOG ) 0.1 % Apply 1 application on to the skin 2  (two) times daily. 12/11/21   Blue, Soijett A, PA-C      Allergies    Patient has no known allergies.    Review of Systems   Review of Systems  Gastrointestinal:  Positive for abdominal pain.    Physical Exam Updated Vital Signs BP (!) 140/83   Pulse 70   Temp 98.3 F (36.8 C)   Resp 18   Ht 5\' 1"  (1.549 m)   SpO2 100%   BMI 40.06 kg/m  Physical Exam  ED Results / Procedures / Treatments   Labs (all labs ordered are listed, but only abnormal results are displayed) Labs Reviewed  LIPASE, BLOOD  COMPREHENSIVE METABOLIC PANEL WITH GFR  CBC  HCG, SERUM, QUALITATIVE  URINALYSIS, ROUTINE W REFLEX MICROSCOPIC    EKG None  Radiology No results found.  Procedures Procedures  {Document cardiac monitor, telemetry assessment procedure when appropriate:1}  Medications Ordered in ED Medications - No data to display  ED Course/ Medical Decision Making/ A&P   {   Click here for ABCD2, HEART and other calculatorsREFRESH Note before signing :1}                              Medical Decision Making Amount and/or Complexity of Data Reviewed Labs: ordered. Radiology: ordered.   ***  {Document  critical care time when appropriate:1} {Document review of labs and clinical decision tools ie heart score, Chads2Vasc2 etc:1}  {Document your independent review of radiology images, and any outside records:1} {Document your discussion with family members, caretakers, and with consultants:1} {Document social determinants of health affecting pt's care:1} {Document your decision making why or why not admission, treatments were needed:1} Final Clinical Impression(s) / ED Diagnoses Final diagnoses:  None    Rx / DC Orders ED Discharge Orders     None

## 2024-05-15 NOTE — ED Notes (Signed)
 Patient transported to CT

## 2024-05-15 NOTE — ED Triage Notes (Signed)
 Patient c/o right sided abdominal pain starting last night that feels like throbbing. Patient has no hx of abdominal surgeries, hx of ovarian cysts, reports she has a IUD that needs to be removed and that she has a low pain tolerance and can't tolerate much. Denies N/V, urinary and GI symptoms, injury, and fever. Also denies rebound tenderness.

## 2024-05-16 NOTE — Discharge Instructions (Signed)
 It was a pleasure taking part in your care.  As we discussed, your workup here is largely reassuring.  The CT scan showed no evidence of appendicitis or other intra-abdominal acute causes of pain.  It did show right sided ovarian cyst measuring 2 cm.  This is most likely source of your pain.  Please begin taking Tylenol  or ibuprofen  at home.  Please follow-up with your OB/GYN team for removal of IUD and further management.  Return to the ED with any new symptoms such as fevers, nausea or vomiting.

## 2024-05-16 NOTE — ED Notes (Signed)
 Patient discharged in stable condition, education materials explained including, follow up, any prescriptions and reasons to return. Patient voiced agreement to education and discharge material.

## 2024-05-16 NOTE — ED Provider Notes (Incomplete)
 [place] EMERGENCY DEPARTMENT AT [facility] Provider Note   CSN: [id number] Arrival date & time: 05/15/24  2026     History  Chief Complaint  Patient presents with  . Abdominal Pain    Stephanie Lee is a 48 y.o. female with medical history of PID in 2012, tubal ligation.  Patient presents to ED for evaluation of right lower quadrant abdominal pain.  She reports that beginning last night before bed she developed pain in her right lower quadrant that does not radiate.  She states that the pain slightly reduced overnight but around 10 AM today she had an increase in the pain.  She describes the pain as throbbing and reports it is located in her right lower quadrant.  She denies any nausea, vomiting, diarrhea at home.  Denies fevers.  Denies dysuria or flank pain.  Reports that her appetite has been sufficient, she is still eating and drinking.  She states she has a history of PID in 2012 where she was admitted to [facility].  She denies any vaginal discharge, concern for STI.  She is unsure of her last menstrual cycle reports it is sometime in the last 3 weeks.   Abdominal Pain Associated symptoms: no diarrhea, no dysuria, no fever, no nausea, no vaginal discharge and no vomiting        Home Medications Prior to Admission medications   Medication Sig Start Date End Date Taking? Authorizing Provider  predniSONE  (STERAPRED UNI-PAK 21 TAB) 10 MG (21) TBPK tablet Take as directed 06/27/23   Sandie Cross, PA-C  traMADol  (ULTRAM ) 50 MG tablet Take 1 tablet (50 mg total) by mouth 2 (two) times daily as needed. 06/27/23   Sandie Cross, PA-C  Acetaminophen  500 MG capsule Take 500-1,000 mg by mouth every 6 (six) hours as needed (for pain).    [provider]  meloxicam  (MOBIC ) 15 MG tablet Take 1 tablet (15 mg total) by mouth daily. 12/08/22   Margaree Shark, MD  methocarbamol  (ROBAXIN ) 500 MG tablet Take 1 tablet (500 mg total) by mouth 2 (two) times daily.  05/09/22   Henderly, Britni A, PA-C  triamcinolone  cream (KENALOG ) 0.1 % Apply 1 application on to the skin 2 (two) times daily. 12/11/21   Blue, Soijett A, PA-C      Allergies    Patient has no known allergies.    Review of Systems   Review of Systems  Constitutional:  Negative for fever.  Gastrointestinal:  Positive for abdominal pain. Negative for diarrhea, nausea and vomiting.  Genitourinary:  Negative for dysuria and vaginal discharge.  All other systems reviewed and are negative.   Physical Exam Updated Vital Signs BP (!) 140/83   Pulse 70   Temp 98.3 F (36.8 C)   Resp 18   Ht 5\' 1"  (1.549 m)   SpO2 100%   BMI 40.06 kg/m  Physical Exam Vitals and nursing note reviewed.  Constitutional:      General: She is not in acute distress.    Appearance: She is well-developed.  HENT:     Head: Normocephalic and atraumatic.  Eyes:     Conjunctiva/sclera: Conjunctivae normal.  Cardiovascular:     Rate and Rhythm: Normal rate and regular rhythm.     Heart sounds: No murmur heard. Pulmonary:     Effort: Pulmonary effort is normal. No respiratory distress.     Breath sounds: Normal breath sounds.  Abdominal:     Palpations: Abdomen is soft.  Tenderness: There is abdominal tenderness.     Comments: TTP of right lower quadrant.  No rebound or guarding.  No CVA tenderness bilaterally.  Musculoskeletal:        General: No swelling.     Cervical back: Neck supple.  Skin:    General: Skin is warm and dry.     Capillary Refill: Capillary refill takes less than 2 seconds.  Neurological:     Mental Status: She is alert and oriented to person, place, and time. Mental status is at baseline.  Psychiatric:        Mood and Affect: Mood normal.     ED Results / Procedures / Treatments   Labs (all labs ordered are listed, but only abnormal results are displayed) Labs Reviewed  LIPASE, BLOOD  COMPREHENSIVE METABOLIC PANEL WITH GFR  CBC  HCG, SERUM, QUALITATIVE  URINALYSIS,  ROUTINE W REFLEX MICROSCOPIC    EKG None  Radiology No results found.  Procedures Procedures   Medications Ordered in ED Medications - No data to display  ED Course/ Medical Decision Making/ A&P  Medical Decision Making Amount and/or Complexity of Data Reviewed Labs: ordered. Radiology: ordered.  Risk Prescription drug management.   48 year old female presents for evaluation.  Please see HPI for further details.  On examination the patient is afebrile and nontachycardic.  Lung sounds clear bilaterally, not hypoxic.  Abdomen is soft and compressible with tenderness in the right lower quadrant.  No rebound or guarding.  No overlying skin change.  No CVA tenderness bilaterally.  Neuroexam at baseline.  Patient overall nontoxic in appearance with reassuring vital signs.     Final Clinical Impression(s) / ED Diagnoses Final diagnoses:  None    Rx / DC Orders ED Discharge Orders     None

## 2024-06-19 ENCOUNTER — Encounter: Payer: Self-pay | Admitting: Sports Medicine

## 2024-06-19 ENCOUNTER — Ambulatory Visit (INDEPENDENT_AMBULATORY_CARE_PROVIDER_SITE_OTHER): Admitting: Sports Medicine

## 2024-06-19 ENCOUNTER — Other Ambulatory Visit (HOSPITAL_BASED_OUTPATIENT_CLINIC_OR_DEPARTMENT_OTHER): Payer: Self-pay

## 2024-06-19 ENCOUNTER — Ambulatory Visit (HOSPITAL_BASED_OUTPATIENT_CLINIC_OR_DEPARTMENT_OTHER)
Admission: RE | Admit: 2024-06-19 | Discharge: 2024-06-19 | Disposition: A | Discharge status: Home / Self Care | Source: Ambulatory Visit | Attending: Sports Medicine | Admitting: Sports Medicine

## 2024-06-19 VITALS — BP 126/88 | Ht 61.0 in | Wt 212.0 lb

## 2024-06-19 DIAGNOSIS — M25561 Pain in right knee: Secondary | ICD-10-CM

## 2024-06-19 MED ORDER — MELOXICAM 15 MG PO TABS
ORAL_TABLET | ORAL | 0 refills | Status: AC
  Filled 2024-06-19: qty 30, 30d supply, fill #0

## 2024-06-19 NOTE — Progress Notes (Signed)
   Subjective:    Patient ID: Stephanie Lee, female    DOB: Apr 26, 1976, 48 y.o.   MRN: [id number]  HPI chief complaint: Right knee pain  Stephanie Lee is a very pleasant 48 year old female who presents today with 2 months of right knee pain.  Pain began suddenly without injury.  Initially she was experiencing some stiffness in the knee.  She is now getting medial knee pain with activity.  She is been taking Tylenol  without any relief.  She denies any problems with the right knee in the past.  Review of her chart does show some mild arthritis in the left knee for which she received a cortisone injection.  She denies any swelling in the knee.  She did get some radiating pain in the leg for couple of weeks but that has since resolved.  She has noticed weakness in the knee.  No prior knee surgeries.  Past medical history reviewed Medications reviewed Allergies reviewed   Review of Systems As above    Objective:   Physical Exam  Well-developed, well-nourished.  No acute distress  Right hip: Smooth painless hip range of motion with a negative logroll  Right knee: Good range of motion.  No effusion.  She is tender to palpation along the medial joint line with a positive Thessaly's.  No tenderness along the lateral joint line.  Knee is stable to ligamentous exam.  Neurovascularly intact distally.      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  We discussed merits of home exercises versus physical therapy.  She would like to start with home exercises first.  We also discussed merits of NSAIDs versus cortisone injection.  She would like to try the medication first.  We will trial meloxicam  15 mg daily for 7 days then as needed.  When I MyChart message her with her x-ray results, I will ask if the medication is effective.  If not, we can  schedule a follow-up appointment for the cortisone injection.  This note was dictated using Dragon naturally speaking software and may contain errors in syntax, spelling, or content which have not been identified prior to signing this note.

## 2024-06-22 ENCOUNTER — Encounter: Payer: Self-pay | Admitting: Sports Medicine

## 2024-07-09 ENCOUNTER — Encounter: Payer: Self-pay | Admitting: Family Medicine

## 2024-07-09 ENCOUNTER — Ambulatory Visit (INDEPENDENT_AMBULATORY_CARE_PROVIDER_SITE_OTHER): Admitting: Family Medicine

## 2024-07-09 VITALS — BP 130/70 | Ht 61.0 in | Wt 212.0 lb

## 2024-07-09 DIAGNOSIS — M25561 Pain in right knee: Secondary | ICD-10-CM

## 2024-07-09 MED ORDER — METHYLPREDNISOLONE ACETATE 40 MG/ML IJ SUSP
40.0000 mg | Freq: Once | INTRAMUSCULAR | Status: AC
  Administered 2024-07-09: 40 mg via INTRA_ARTICULAR

## 2024-07-10 NOTE — Progress Notes (Signed)
 PCP: Alvie Catheryn POUR, MD  Subjective:   HPI: Patient is a 48 y.o. female here for right knee pain.  7/15: Stephanie Lee is a very pleasant 48 year old female who presents today with 2 months of right knee pain.  Pain began suddenly without injury.  Initially she was experiencing some stiffness in the knee.  She is now getting medial knee pain with activity.  She is been taking Tylenol  without any relief.  She denies any problems with the right knee in the past.  Review of her chart does show some mild arthritis in the left knee for which she received a cortisone injection.  She denies any swelling in the knee.  She did get some radiating pain in the leg for couple of weeks but that has since resolved.  She has noticed weakness in the knee.  No prior knee surgeries.  8/4: Patient reports right knee has not been improving with conservative treatment. Been taking meloxicam . Interested in pursuing injection discussed at last visit.  Past Medical History:  Diagnosis Date   PID (pelvic inflammatory disease) 2012    Current Outpatient Medications on File Prior to Visit  Medication Sig Dispense Refill   predniSONE  (STERAPRED UNI-PAK 21 TAB) 10 MG (21) TBPK tablet Take as directed 21 tablet 0   traMADol  (ULTRAM ) 50 MG tablet Take 1 tablet (50 mg total) by mouth 2 (two) times daily as needed. 30 tablet 0   Acetaminophen  500 MG capsule Take 500-1,000 mg by mouth every 6 (six) hours as needed (for pain).     meloxicam  (MOBIC ) 15 MG tablet Take 1 tablet (15 mg total) by mouth daily for 7 days, THEN 1 tablet (15 mg total) as needed. Take with food. 30 tablet 0   methocarbamol  (ROBAXIN ) 500 MG tablet Take 1 tablet (500 mg total) by mouth 2 (two) times daily. 20 tablet 0   triamcinolone  cream (KENALOG ) 0.1 % Apply 1 application on to the skin 2 (two) times daily. 30 g 0   No current facility-administered medications on file prior to visit.    Past Surgical History:  Procedure Laterality Date   CESAREAN  SECTION     DILATION AND CURETTAGE OF UTERUS     TONSILLECTOMY     TUBAL LIGATION      No Known Allergies  BP 130/70 (BP Location: Left Arm, Patient Position: Sitting)   Ht 5' 1 (1.549 m)   Wt 212 lb (96.2 kg)   LMP 04/05/2024 (Approximate) Comment: Recent IUD removal  BMI 40.06 kg/m       No data to display              No data to display              Objective:  Physical Exam:  Gen: NAD, comfortable in exam room  Right knee: No gross deformity, ecchymoses, swelling. TTP medial joint line. FROM with normal strength. Negative ant/post drawers. Negative valgus/varus testing. Negative lachman.  Negative mcmurrays, apleys.  NV intact distally.   Assessment & Plan:  1. Right knee pain - 2/2 flare of mild arthritis.  Not improving with meloxicam , conservative measures.  Injection given today.  Follow up in [facility] office.  After informed written consent timeout was performed, patient was seated on exam table. Right knee was prepped with alcohol swab and utilizing anteromedial approach, patient's right knee was injected intraarticularly with 3:1 lidocaine : depomedrol. Patient tolerated the procedure well without immediate complications.

## 2024-07-18 ENCOUNTER — Telehealth: Payer: Self-pay | Admitting: Family Medicine

## 2024-07-18 MED ORDER — DICLOFENAC SODIUM 75 MG PO TBEC: 75.0000 mg | DELAYED_RELEASE_TABLET | Freq: Two times a day (BID) | ORAL | 1 refills | Status: DC

## 2024-07-18 NOTE — Telephone Encounter (Signed)
 Yeah she would have to contact us  on or after 8/26 after doing her home exercises and anti-inflammatories.  That would be the 6 week mark.  If not improving at that point then we could go ahead with an MRI.  We can try diclofenac  if her insurance will approve.  I'll send in.

## 2024-07-18 NOTE — Telephone Encounter (Signed)
-----   Message from [facility] sent at 07/17/2024  4:45 PM EDT ----- Regarding: FW: phone message I spoke with her. States meloxicam  hasn't really helped. Claimed someone told her that the next step would be MRI? I don't see that anywhere in the note but I did discuss with her that she would need at least 6 weeks of provider directed conservative tx with f/u showing no improvement in pain before insurance would think about authorizing an MRI - if that's the route we go. She's not interested in PT, states she is doing the HEP she was given at home. She wears a compression sleeve. Interested in alternate NSAID if you want to send in something. Let me know! Thanks! ----- Message ----- From: Stephanie Lee Sent: 07/17/2024   2:09 PM EDT To: Duwaine Bolognese, LAT Subject: phone message                                  Pt called stating she is still having rt knee pain after injection x 1 week. She is asking for a call back to discuss next step.

## 2024-08-01 ENCOUNTER — Ambulatory Visit (INDEPENDENT_AMBULATORY_CARE_PROVIDER_SITE_OTHER): Admitting: Family Medicine

## 2024-08-01 VITALS — BP 128/76 | Ht 61.0 in | Wt 236.0 lb

## 2024-08-01 DIAGNOSIS — M25561 Pain in right knee: Secondary | ICD-10-CM

## 2024-08-01 NOTE — Progress Notes (Unsigned)
 PCP: Alvie Catheryn POUR, MD  Subjective:   HPI: Patient is a 48 y.o. female here for follow-up evaluation of ongoing right knee pain, she was last seen in clinic on 07/09/2024.  Patient reports that she continues to have pain in her right knee but that it feels about 60% better. She received a steroid injection at her most recent appointment and found it somewhat helpful. She was continuing to have pain so she was provided with a prescription for Diclofenac  but she is unsure whether or not this has helped with the pain. She has been doing home physical therapy exercises. She still has pain/soreness in her right knee, especially after doing a lot of walking. She does believe her knee is less stiff.   Past Medical History:  Diagnosis Date   PID (pelvic inflammatory disease) 2012    Current Outpatient Medications on File Prior to Visit  Medication Sig Dispense Refill   predniSONE  (STERAPRED UNI-PAK 21 TAB) 10 MG (21) TBPK tablet Take as directed 21 tablet 0   traMADol  (ULTRAM ) 50 MG tablet Take 1 tablet (50 mg total) by mouth 2 (two) times daily as needed. 30 tablet 0   Acetaminophen  500 MG capsule Take 500-1,000 mg by mouth every 6 (six) hours as needed (for pain).     diclofenac  (VOLTAREN ) 75 MG EC tablet Take 1 tablet (75 mg total) by mouth 2 (two) times daily. 60 tablet 1   methocarbamol  (ROBAXIN ) 500 MG tablet Take 1 tablet (500 mg total) by mouth 2 (two) times daily. 20 tablet 0   triamcinolone  cream (KENALOG ) 0.1 % Apply 1 application on to the skin 2 (two) times daily. 30 g 0   No current facility-administered medications on file prior to visit.    Past Surgical History:  Procedure Laterality Date   CESAREAN SECTION     DILATION AND CURETTAGE OF UTERUS     TONSILLECTOMY     TUBAL LIGATION      No Known Allergies  BP 128/76   Ht 5' 1 (1.549 m)   Wt 236 lb (107 kg)   BMI 44.59 kg/m       No data to display              No data to display              Objective:   Physical Exam:  Gen: NAD, comfortable in exam room  MSK: Right knee Inspection: No bony or soft tissue abnormalities appreciated. Appears symmetrical in comparison to left knee.  Palpation: There is tenderness to palpation of the medial joint line.  ROM: FROM with knee extension/flexion. No ligamentous laxity appreciated with varus/valgus stress.  Strength: 5/5 with knee extension/flexion, hip flexion, and dorsiflexion/plantarflexion.  Neuro/Vasc: Neurovascularly intact distally.  Special Tests: Negative McMurray's, negative Apley's    Assessment & Plan:  1. Chronic right knee pain - While arthritis is felt to be contributing to her pain would have expected more relief with steroid injection. She achieved partial relief of symptoms from steroid injection on 07/09/2024. She has tried taking Meloxicam  and Diclofenac  without much effect. Given that she received only partial relief of symptoms from her most recent steroid injection, discussed with patient that we will obtain an MRI of her right knee to evaluate for signs of more severe arthritis versus a meniscal tear. Consider ortho referral to discuss surgical intervention depending on these results.  Patient is in agreement with this plan. Advised patient to schedule a follow-up appointment to review  her MRI results.  - MRI right knee - Follow-up pending MRI results   Signe Ravel, MS4 [facility]

## 2024-08-02 ENCOUNTER — Encounter: Payer: Self-pay | Admitting: Family Medicine

## 2024-08-16 ENCOUNTER — Ambulatory Visit
Admission: RE | Admit: 2024-08-16 | Discharge: 2024-08-16 | Disposition: A | Discharge status: Home / Self Care | Source: Ambulatory Visit | Attending: Family Medicine | Admitting: Family Medicine

## 2024-08-16 DIAGNOSIS — M25561 Pain in right knee: Secondary | ICD-10-CM

## 2024-08-22 ENCOUNTER — Telehealth (INDEPENDENT_AMBULATORY_CARE_PROVIDER_SITE_OTHER): Admitting: Family Medicine

## 2024-08-22 ENCOUNTER — Other Ambulatory Visit: Payer: Self-pay

## 2024-08-22 DIAGNOSIS — M25561 Pain in right knee: Secondary | ICD-10-CM

## 2024-08-22 NOTE — Progress Notes (Signed)
 Patient reports she's doing about the same.  MRI results reviewed and discussed with her.  She does have a moderate sized radial medial meniscus tear.  Given not improving with conservative treatment will refer to ortho to discuss consideration of arthroscopic debridement.

## 2024-09-12 ENCOUNTER — Other Ambulatory Visit (HOSPITAL_COMMUNITY): Payer: Self-pay

## 2024-09-12 ENCOUNTER — Ambulatory Visit (HOSPITAL_COMMUNITY)
Admission: RE | Admit: 2024-09-12 | Discharge: 2024-09-12 | Disposition: A | Discharge status: Home / Self Care | Source: Ambulatory Visit | Attending: Vascular Surgery | Admitting: Vascular Surgery

## 2024-09-12 ENCOUNTER — Telehealth: Payer: Self-pay

## 2024-09-12 ENCOUNTER — Other Ambulatory Visit (HOSPITAL_COMMUNITY): Payer: Self-pay | Admitting: Orthopaedic Surgery

## 2024-09-12 ENCOUNTER — Ambulatory Visit: Attending: Vascular Surgery | Admitting: Pharmacist

## 2024-09-12 VITALS — BP 126/86 | HR 65

## 2024-09-12 DIAGNOSIS — R52 Pain, unspecified: Secondary | ICD-10-CM | POA: Insufficient documentation

## 2024-09-12 DIAGNOSIS — I82441 Acute embolism and thrombosis of right tibial vein: Secondary | ICD-10-CM | POA: Diagnosis not present

## 2024-09-12 MED ORDER — APIXABAN 5 MG PO TABS: 5.0000 mg | ORAL_TABLET | Freq: Two times a day (BID) | ORAL | 1 refills | Status: DC

## 2024-09-12 MED ORDER — ELIQUIS DVT/PE STARTER PACK 5 MG PO TBPK
ORAL_TABLET | ORAL | 0 refills | Status: DC
  Filled 2024-09-12: qty 74, 30d supply, fill #0

## 2024-09-12 NOTE — Progress Notes (Signed)
 DVT Clinic Note  Name: Stephanie Lee     MRN: [id number]     DOB: Feb 07, 1976     Sex: female  PCP: Alvie Catheryn POUR, MD  Today's Visit: Visit Information: Initial Visit  Referred to DVT Clinic by: Orthopedic Surgery - Dr. Bonner Hair, MD Referred to CPP by: Dr. Sheree Reason for referral:  Chief Complaint  Patient presents with   DVT   HISTORY OF PRESENT ILLNESS: Stephanie Lee is a 48 y.o. female with PMH PID, fibroids, tubal ligation who presents after diagnosis of DVT for medication management. Patient is s/p right medial meniscus repair on 09/03/24. Saw ortho for follow up yesterday and reported pain in her calf that began about 2 days prior. She was referred for an ultrasound to rule out DVT. Ultrasound completed today showed acute DVT involving the right posterior tibial and peroneal veins. Patient presents to clinic accompanied by her husband ambulating with assistance from a wheelchair. Reports tenderness in her right calf and denies swelling. She has been in a brace since surgery and was unable to bend her leg until the brace was adjusted yesterday. She also began PT last Thursday. Orthopedic surgery recommended for her to take aspirin after surgery for DVT prophylaxis, but she did not start taking it until 09/11/24 as she was waiting for this to process through her insurance. Last menstrual period began yesterday. She expressed concern over worsening menstrual bleeding with starting on a blood thinner. She had her IUD removed a couple months ago and stated her first period post removal last month was very heavy, but her current period is not as heavy. Denies chest pain and SOB.  Positive Thrombotic Risk Factors: Recent surgery (within 3 months), Bed rest >72 hours within 3 month, Obesity Bleeding Risk Factors: None Present  Negative Thrombotic Risk Factors: Previous VTE, Recent trauma (within 3 months), Recent admission to hospital with acute illness (within 3 months), Paralysis, paresis,  or recent plaster cast immobilization of lower extremity, Central venous catheterization, Sedentary journey lasting >8 hours within 4 weeks, Pregnancy, Recent cesarean section (within 3 months), Estrogen therapy, Within 6 weeks postpartum, Testosterone therapy, Erythropoiesis-stimulating agent, Recent COVID diagnosis (within 3 months), Smoking, Known thrombophilic condition, Non-malignant, chronic inflammatory condition, Active cancer, Older age  Rx Insurance Coverage: Commercial Rx Affordability: Eliquis is $65 per 1 month supply and she can use the copay card to reduce the cost to $10 per month.   Rx Assistance Provided: Co-pay card Preferred Pharmacy: Eliquis starter pack filled at [facility] during appointment. Refills sent to [facility] per patient preference.  Past Medical History:  Diagnosis Date   PID (pelvic inflammatory disease) 2012    Past Surgical History:  Procedure Laterality Date   CESAREAN SECTION     DILATION AND CURETTAGE OF UTERUS     TONSILLECTOMY     TUBAL LIGATION      Social History   Socioeconomic History   Marital status: Single    Spouse name: Not on file   Number of children: Not on file   Years of education: Not on file   Highest education level: Not on file  Occupational History   Not on file  Tobacco Use   Smoking status: Never   Smokeless tobacco: Never  Vaping Use   Vaping status: Never Used  Substance and Sexual Activity   Alcohol use: No   Drug use: No   Sexual activity: Yes    Birth control/protection: Surgical  Other Topics Concern   Not  on file  Social History Narrative   Not on file   Social Drivers of Health   Financial Resource Strain: Not on file  Food Insecurity: Not on file  Transportation Needs: Not on file  Physical Activity: Not on file  Stress: Not on file  Social Connections: Not on file  Intimate Partner Violence: Not on file    Family History  Problem Relation Age of Onset   Arthritis Mother     Hypertension Mother    Arthritis Father    Cancer Father    Hypertension Sister    Stroke Sister    Kidney disease Sister    Other Neg Hx    Breast cancer Neg Hx     Allergies as of 09/12/2024   (No Known Allergies)    Current Outpatient Medications on File Prior to Visit  Medication Sig Dispense Refill   Acetaminophen  500 MG capsule Take 500-1,000 mg by mouth every 6 (six) hours as needed (for pain).     meloxicam  (MOBIC ) 15 MG tablet Take 15 mg by mouth daily.     methocarbamol  (ROBAXIN ) 500 MG tablet Take 1 tablet (500 mg total) by mouth 2 (two) times daily. 20 tablet 0   traMADol  (ULTRAM ) 50 MG tablet Take 1 tablet (50 mg total) by mouth 2 (two) times daily as needed. 30 tablet 0   triamcinolone  cream (KENALOG ) 0.1 % Apply 1 application on to the skin 2 (two) times daily. 30 g 0   No current facility-administered medications on file prior to visit.   REVIEW OF SYSTEMS:  Review of Systems  Respiratory:  Negative for shortness of breath.   Cardiovascular:  Negative for chest pain and leg swelling.  Musculoskeletal:  Negative for myalgias.   PHYSICAL EXAMINATION:  Vitals:   09/12/24 1451  BP: 126/86  Pulse: 65  SpO2: 96%    There is no height or weight on file to calculate BMI.  Physical Exam Vitals reviewed.  Pulmonary:     Effort: Pulmonary effort is normal.  Musculoskeletal:        General: Tenderness present.     Right lower leg: No edema.  Neurological:     Mental Status: She is alert.  Psychiatric:        Mood and Affect: Mood normal.    Villalta Score for Post-Thrombotic Syndrome: Pain: Mild Cramps: Absent Heaviness: Absent Paresthesia: Absent Pruritus: Absent Pretibial Edema: Absent Skin Induration: Absent Hyperpigmentation: Absent Redness: Absent Venous Ectasia: Absent Pain on calf compression: Mild Villalta Preliminary Score: 2 Is venous ulcer present?: No If venous ulcer is present and score is <15, then 15 points total are assigned:  Absent Villalta Total Score: 2  LABS:  CBC     Component Value Date/Time   WBC 9.1 05/15/2024 2050   RBC 4.12 05/15/2024 2050   HGB 12.2 05/15/2024 2050   HCT 36.6 05/15/2024 2050   PLT 335 05/15/2024 2050   MCV 88.8 05/15/2024 2050   MCH 29.6 05/15/2024 2050   MCHC 33.3 05/15/2024 2050   RDW 13.3 05/15/2024 2050   LYMPHSABS 3.1 12/21/2018 1831   MONOABS 0.7 12/21/2018 1831   EOSABS 0.4 12/21/2018 1831   BASOSABS 0.0 12/21/2018 1831    Hepatic Function      Component Value Date/Time   PROT 6.5 05/15/2024 2050   ALBUMIN 3.6 05/15/2024 2050   AST 20 05/15/2024 2050   ALT 16 05/15/2024 2050   ALKPHOS 54 05/15/2024 2050   BILITOT 0.4 05/15/2024 2050  Renal Function   Lab Results  Component Value Date   CREATININE 0.81 05/15/2024   CREATININE 0.81 05/01/2021   CREATININE 0.76 12/21/2018    CrCl cannot be calculated (Patient's most recent lab result is older than the maximum 21 days allowed.).   VVS Vascular Lab Studies:  VAS US  LOWER EXTREMITY VENOUS (DVT) Summary:  RIGHT:  - Findings consistent with acute deep vein thrombosis involving the right  posterior tibial veins, and right peroneal veins.    - No cystic structure found in the popliteal fossa.    LEFT:  - No evidence of common femoral vein obstruction.     ASSESSMENT: Location of DVT: Right distal vein Cause of DVT: provoked by a transient risk factor  Patient without history of DVT diagnosed with acute DVT in the right posterior tibial and peroneal veins. She is s/p right meniscus repair surgery a little over a week ago and her right leg has been in a brace since then. Appropriate to start on anticoagulation. Will start on Eliquis VTE starter pack dosing. No concerns on labs for Eliquis. Plan to treat with anticoagulation for 3 months for first provoked DVT. Recommended to stop taking aspirin for DVT prophylaxis as she is now starting on anticoagulation. Extensively counseled on Eliquis and all patient's  questions were answered. Provided her with copay card to reduce cost to $10 per month. No barriers to medication adherence identified.   PLAN: -Start apixaban (Eliquis) 10 mg twice daily for 7 days followed by 5 mg twice daily. -Expected duration of therapy: 3 months. Therapy started on 09/12/24. -Patient educated on purpose, proper use and potential adverse effects of apixaban (Eliquis). -Discussed importance of taking medication around the same time every day. -Advised patient of medications to avoid (NSAIDs, aspirin doses >100 mg daily). -Educated that Tylenol  (acetaminophen ) is the preferred analgesic to lower the risk of bleeding. -Advised patient to alert all providers of anticoagulation therapy prior to starting a new medication or having a procedure. -Emphasized importance of monitoring for signs and symptoms of bleeding (abnormal bruising, prolonged bleeding, nose bleeds, bleeding from gums, discolored urine, black tarry stools). -Educated patient to present to the ED if emergent signs and symptoms of new thrombosis occur. -Counseled patient to wear compression stockings daily, removing at night. Elevate leg to help with swelling.  Follow up: DVT Clinic in 3 months  Izetta Henry, PharmD [facility] Clinical Pharmacist [phone]

## 2024-09-12 NOTE — Patient Instructions (Signed)
-  Start apixaban (Eliquis) 10 mg twice daily for 7 days followed by 5 mg twice daily. -Your refills have been sent to [facility]. You may need to call the pharmacy to ask them to fill this when you start to run low on your current supply.  -It is important to take your medication around the same time every day.  -Avoid NSAIDs like ibuprofen  (Advil , Motrin ) and naproxen (Aleve) as well as aspirin  doses over 100 mg daily. -Tylenol  (acetaminophen ) is the preferred over the counter pain medication to lower the risk of bleeding. -Be sure to alert all of your health care providers that you are taking an anticoagulant prior to starting a new medication or having a procedure. -Monitor for signs and symptoms of bleeding (abnormal bruising, prolonged bleeding, nose bleeds, bleeding from gums, discolored urine, black tarry stools). If you have fallen and hit your head OR if your bleeding is severe or not stopping, seek emergency care.  -Go to the emergency room if emergent signs and symptoms of new clot occur (new or worse swelling and pain in an arm or leg, shortness of breath, chest pain, fast or irregular heartbeats, lightheadedness, dizziness, fainting, coughing up blood) or if you experience a significant color change (pale or blue) in the extremity that has the DVT.  -We recommend you wear compression stockings (20-30 mmHg) as long as you are having swelling or pain. Be sure to purchase the correct size and take them off at night.   If you have any questions or need to reschedule an appointment, please call [phone]. If you are having an emergency, call 911 or present to the nearest emergency room.   What is a DVT?  -Deep vein thrombosis (DVT) is a condition in which a blood clot forms in a vein of the deep venous system which can occur in the lower leg, thigh, pelvis, arm, or neck. This condition is serious and can be life-threatening if the clot travels to the arteries of the lungs and causing a  blockage (pulmonary embolism, PE). A DVT can also damage veins in the leg, which can lead to long-term venous disease, leg pain, swelling, discoloration, and ulcers or sores (post-thrombotic syndrome).  -Treatment may include taking an anticoagulant medication to prevent more clots from forming and the current clot from growing, wearing compression stockings, and/or surgical procedures to remove or dissolve the clot.

## 2024-09-12 NOTE — Telephone Encounter (Signed)
 Patient Advocate Encounter  Test billing for this patient's current coverage ([employer]) returns a $65 copay for 30 day supply of Eliquis. This plan limits 30 day fill, and this patient is eligible to use copay savings card to reduce cost.  This test claim was processed through [facility]- copay amounts may vary at other pharmacies due to [employer], or as the patient moves through the different stages of their insurance plan.  Rachel DEL, CPhT Rx Patient Advocate Phone: [phone]

## 2024-10-09 ENCOUNTER — Telehealth: Payer: Self-pay | Admitting: Pharmacist

## 2024-10-09 NOTE — Telephone Encounter (Signed)
 Returned patients called regarding pain in her right calf. She reports her right calf began to feel more tight over the past few days. She says when she woke up this morning the tightness had improved some. She denies missed doses of Eliquis. She recently started physical therapy and has been completing new exercises. Discussed that it is common for pain and swelling to come and go after diagnosis of DVT. Counseled her to continue taking Eliquis as prescribed. Discussed that should she have pain or swelling in new places or persistent pain and swelling she should let us  know. Encouraged compression and elevation. All patient questions were answered.

## 2024-12-12 ENCOUNTER — Ambulatory Visit: Attending: Surgery | Admitting: Pharmacist

## 2024-12-12 VITALS — BP 130/87 | HR 90

## 2024-12-12 DIAGNOSIS — I82441 Acute embolism and thrombosis of right tibial vein: Secondary | ICD-10-CM

## 2024-12-12 NOTE — Patient Instructions (Signed)
 Finish your current supply of Eliquis  then you can discontinue the medication.

## 2024-12-12 NOTE — Progress Notes (Signed)
 " DVT Clinic Note  Name: Stephanie Lee     MRN: [id number]     DOB: 07-18-1976     Sex: female  PCP: Alvie Catheryn POUR, MD  Today's Visit: Visit Information: Discharge Visit  Referred to DVT Clinic by: Orthopedic Surgery - Dr. Bonner Hair Referred to CPP by: Dr. Sheree Reason for referral:  Chief Complaint  Patient presents with   DVT   HISTORY OF PRESENT ILLNESS: Stephanie Lee is a 49 y.o. female with PMH PID, fibroids, tubal ligation who presents after diagnosis of DVT for medication management. Patient is s/p right medial meniscus repair on 09/03/24. Saw ortho for follow up on 09/11/24 and reported pain in her calf that began about 2 days prior. She was referred for an ultrasound to rule out DVT. Ultrasound completed 09/12/24 showed acute DVT involving the right posterior tibial and peroneal veins. Patient presented to DVT clinic on 09/12/24 with reported tenderness in right calf and denied swelling. Orthopedic surgery recommended for her to take aspirin after surgery for DVT prophylaxis, but she did not start taking it until 09/11/24 as she was waiting for this to process through her insurance. During DVT clinic visit, she expressed concern over worsening menstrual bleeding with starting on a blood thinner. She had her IUD removed and her first period post removal was very heavy, but her most recent period was not as heavy. Patient was initiated on Eliquis  VTE starter pack with an expected duration of therapy for 3 months. Aspirin for DVT prophylaxis was discontinued.  Today, patient reports to being doing well. Patient has been going to PT twice a week and says that she is experiencing stiffness in her right leg but this is improving. Endorses swelling in her legs, the right greater than left that waxes and wanes and improves with compression. Reports prior to DVT, she had some chronic bilateral swelling. Denies missed doses of Eliquis . Reports that she has a few tablets of Eliquis  left. Endorses very  heavy menstrual bleeding that lasted for a month after starting Eliquis . Her second period was not quite as bad, but still longer than normal for her. Reports she called her gynecologist who told her to continue taking Eliquis . Otherwise denies abnormal bleeding or bruising. Denies chest pain and SOB. Patient has not been consistently wearing compression stockings.   Positive Thrombotic Risk Factors: Recent surgery (within 3 months), Bed rest >72 hours within 3 month, Obesity Bleeding Risk Factors: Frequent falls  Negative Thrombotic Risk Factors: Previous VTE, Paralysis, paresis, or recent plaster cast immobilization of lower extremity, Recent trauma (within 3 months), Recent admission to hospital with acute illness (within 3 months), Central venous catheterization, Pregnancy, Within 6 weeks postpartum, Recent cesarean section (within 3 months), Sedentary journey lasting >8 hours within 4 weeks, Estrogen therapy, Testosterone therapy, Erythropoiesis-stimulating agent, Recent COVID diagnosis (within 3 months), Active cancer, Smoking, Known thrombophilic condition, Non-malignant, chronic inflammatory condition, Older age  Rx Insurance Coverage: Commercial Rx Affordability: Eliquis  is $65 per 1 month supply and she can use the copay card to reduce the cost to $10 per month.   Rx Assistance Provided: Co-pay card Preferred Pharmacy: [facility]  Past Medical History:  Diagnosis Date   PID (pelvic inflammatory disease) 2012    Past Surgical History:  Procedure Laterality Date   CESAREAN SECTION     DILATION AND CURETTAGE OF UTERUS     TONSILLECTOMY     TUBAL LIGATION      Social History   Socioeconomic History   Marital  status: Single    Spouse name: Not on file   Number of children: Not on file   Years of education: Not on file   Highest education level: Not on file  Occupational History   Not on file  Tobacco Use   Smoking status: Never   Smokeless tobacco: Never  Vaping Use   Vaping  status: Never Used  Substance and Sexual Activity   Alcohol use: No   Drug use: No   Sexual activity: Yes    Birth control/protection: Surgical  Other Topics Concern   Not on file  Social History Narrative   Not on file   Social Drivers of Health   Tobacco Use: Low Risk (08/02/2024)   Patient History    Smoking Tobacco Use: Never    Smokeless Tobacco Use: Never    Passive Exposure: Not on file  Financial Resource Strain: Not on file  Food Insecurity: Not on file  Transportation Needs: Not on file  Physical Activity: Not on file  Stress: Not on file  Social Connections: Not on file  Intimate Partner Violence: Not on file  Depression (EYV7-0): Not on file  Alcohol Screen: Not on file  Housing: Not on file  Utilities: Not on file  Health Literacy: Not on file    Family History  Problem Relation Age of Onset   Arthritis Mother    Hypertension Mother    Arthritis Father    Cancer Father    Hypertension Sister    Stroke Sister    Kidney disease Sister    Other Neg Hx    Breast cancer Neg Hx     Allergies as of 12/12/2024   (No Known Allergies)    Medications Ordered Prior to Encounter[1] REVIEW OF SYSTEMS:  Review of Systems  Respiratory:  Negative for shortness of breath.   Cardiovascular:  Positive for leg swelling. Negative for chest pain.   PHYSICAL EXAMINATION:  Vitals:   12/12/24 1419  BP: 130/87  Pulse: 90  SpO2: 99%    There is no height or weight on file to calculate BMI.  Physical Exam Vitals reviewed.  Cardiovascular:     Pulses: Normal pulses.  Pulmonary:     Effort: Pulmonary effort is normal.  Musculoskeletal:        General: No tenderness.     Right lower leg: Edema present.  Psychiatric:        Mood and Affect: Mood normal.    Villalta Score for Post-Thrombotic Syndrome: Pain: Absent Cramps: Absent Heaviness: Mild Paresthesia: Absent Pruritus: Absent Pretibial Edema: Mild Skin Induration: Absent Hyperpigmentation:  Absent Redness: Absent Venous Ectasia: Absent Pain on calf compression: Absent Villalta Preliminary Score: 2 Is venous ulcer present?: No If venous ulcer is present and score is <15, then 15 points total are assigned: Absent Villalta Total Score: 2  LABS:  CBC     Component Value Date/Time   WBC 9.1 05/15/2024 2050   RBC 4.12 05/15/2024 2050   HGB 12.2 05/15/2024 2050   HCT 36.6 05/15/2024 2050   PLT 335 05/15/2024 2050   MCV 88.8 05/15/2024 2050   MCH 29.6 05/15/2024 2050   MCHC 33.3 05/15/2024 2050   RDW 13.3 05/15/2024 2050   LYMPHSABS 3.1 12/21/2018 1831   MONOABS 0.7 12/21/2018 1831   EOSABS 0.4 12/21/2018 1831   BASOSABS 0.0 12/21/2018 1831    Hepatic Function      Component Value Date/Time   PROT 6.5 05/15/2024 2050   ALBUMIN 3.6 05/15/2024 2050  AST 20 05/15/2024 2050   ALT 16 05/15/2024 2050   ALKPHOS 54 05/15/2024 2050   BILITOT 0.4 05/15/2024 2050    Renal Function   Lab Results  Component Value Date   CREATININE 0.81 05/15/2024   CREATININE 0.81 05/01/2021   CREATININE 0.76 12/21/2018    CrCl cannot be calculated (Patient's most recent lab result is older than the maximum 21 days allowed.).   VVS Vascular Lab Studies:  VAS US  LOWER EXTREMITY VENOUS (DVT) Summary:  RIGHT:  - Findings consistent with acute deep vein thrombosis involving the right  posterior tibial veins, and right peroneal veins.    - No cystic structure found in the popliteal fossa.    LEFT:  - No evidence of common femoral vein obstruction.   ASSESSMENT: Location of DVT: Right distal vein Cause of DVT: provoked by a transient risk factor  Patient without history of DVT diagnosed with acute DVT in the right posterior tibial and peroneal veins on 09/12/24. She had right meniscus repair surgery on 09/03/24. Patient was seen in DVT Clinic and started on Eliquis  with plan to continue treatment for a total of 3 months for first provoked DVT. She had very heavy menstrual bleeding with  Eliquis , but otherwise has tolerated treatment fairly well. She has a few tablets of Eliquis  left at home. Her mobility is improved and she is mostly back her normal daily activities and participates in PT twice a week. Will have her finish her current supply of Eliquis  then discontinue medication. Provided patient with VTE risk reduction strategies to prevent DVTs in the future. Counseled her on the plan and all patient's questions were answered.  PLAN: -Patient is discharged from the DVT Clinic. -Discontinue anticoagulation with Eliquis , as patient has completed 3 months of treatment for provoked DVT.  -Counseled patient on future VTE risk reduction strategies and to inform all future providers of DVT history.  Follow up: with PCP  Jenkins Graces, PharmD PGY1 Pharmacy Resident  Izetta Henry, PharmD, CPP [facility] [phone]     [1]  Current Outpatient Medications on File Prior to Visit  Medication Sig Dispense Refill   Acetaminophen  500 MG capsule Take 500-1,000 mg by mouth every 6 (six) hours as needed (for pain).     triamcinolone  cream (KENALOG ) 0.1 % Apply 1 application on to the skin 2 (two) times daily. 30 g 0   No current facility-administered medications on file prior to visit.   "
